# Patient Record
Sex: Male | Born: 1985 | Hispanic: No | Marital: Single | Smoking: Never smoker
Health system: Southern US, Community
[De-identification: ages and names within clinical notes are randomized; demographics above are authoritative.]

## PROBLEM LIST (undated history)

## (undated) DIAGNOSIS — F419 Anxiety disorder, unspecified: Secondary | ICD-10-CM

## (undated) DIAGNOSIS — R569 Unspecified convulsions: Secondary | ICD-10-CM

## (undated) DIAGNOSIS — F32A Depression, unspecified: Secondary | ICD-10-CM

## (undated) HISTORY — DX: Anxiety disorder, unspecified: F41.9

## (undated) HISTORY — DX: Depression, unspecified: F32.A

## (undated) HISTORY — DX: Unspecified convulsions: R56.9

---
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
## 2007-07-15 ENCOUNTER — Emergency Department (HOSPITAL_COMMUNITY): Admission: EM | Admit: 2007-07-15 | Discharge: 2007-07-15 | Payer: Self-pay | Admitting: Emergency Medicine

## 2007-07-22 ENCOUNTER — Emergency Department (HOSPITAL_COMMUNITY): Admission: EM | Admit: 2007-07-22 | Discharge: 2007-07-22 | Payer: Self-pay | Admitting: Emergency Medicine

## 2009-03-02 ENCOUNTER — Emergency Department (HOSPITAL_COMMUNITY): Admission: EM | Admit: 2009-03-02 | Discharge: 2009-03-02 | Payer: Self-pay | Admitting: Emergency Medicine

## 2010-04-17 ENCOUNTER — Emergency Department (HOSPITAL_COMMUNITY): Admission: EM | Admit: 2010-04-17 | Discharge: 2010-04-17 | Payer: Self-pay | Admitting: Family Medicine

## 2011-02-07 LAB — POCT I-STAT, CHEM 8
BUN: 13 mg/dL (ref 6–23)
Chloride: 101 mEq/L (ref 96–112)
Creatinine, Ser: 0.9 mg/dL (ref 0.4–1.5)
Glucose, Bld: 98 mg/dL (ref 70–99)
HCT: 44 % (ref 39.0–52.0)
Potassium: 4.1 mEq/L (ref 3.5–5.1)
Sodium: 140 mEq/L (ref 135–145)

## 2012-05-22 ENCOUNTER — Emergency Department (HOSPITAL_COMMUNITY)
Admission: EM | Admit: 2012-05-22 | Discharge: 2012-05-22 | Payer: Self-pay | Attending: Emergency Medicine | Admitting: Emergency Medicine

## 2012-05-22 ENCOUNTER — Encounter (HOSPITAL_COMMUNITY): Payer: Self-pay

## 2012-05-22 ENCOUNTER — Emergency Department (HOSPITAL_COMMUNITY)
Admission: EM | Admit: 2012-05-22 | Discharge: 2012-05-22 | Disposition: A | Discharge status: Home / Self Care | Payer: Self-pay | Attending: Emergency Medicine | Admitting: Emergency Medicine

## 2012-05-22 DIAGNOSIS — F1910 Other psychoactive substance abuse, uncomplicated: Secondary | ICD-10-CM

## 2012-05-22 DIAGNOSIS — Z0189 Encounter for other specified special examinations: Secondary | ICD-10-CM | POA: Insufficient documentation

## 2012-05-22 DIAGNOSIS — Z1389 Encounter for screening for other disorder: Secondary | ICD-10-CM | POA: Insufficient documentation

## 2012-05-22 DIAGNOSIS — IMO0001 Reserved for inherently not codable concepts without codable children: Secondary | ICD-10-CM | POA: Insufficient documentation

## 2012-05-22 LAB — HIV RAPID SCREEN (BLD OR BODY FLD EXPOSURE): Rapid HIV Screen: NONREACTIVE
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
## 2012-05-23 ENCOUNTER — Encounter (HOSPITAL_COMMUNITY): Payer: Self-pay

## 2012-05-23 LAB — HEPATITIS C ANTIBODY (REFLEX): HCV Ab: NEGATIVE

## 2012-05-23 LAB — HEPATITIS B SURFACE ANTIGEN: Hepatitis B Surface Ag: NEGATIVE

## 2012-05-30 NOTE — ED Provider Notes (Signed)
Medical screening examination/treatment/procedure(s) were performed by non-physician practitioner and as supervising physician I was immediately available for consultation/collaboration.  Raeford Razor, MD 05/30/12 [phone]

## 2017-03-27 ENCOUNTER — Ambulatory Visit
Admission: RE | Admit: 2017-03-27 | Discharge: 2017-03-27 | Disposition: A | Discharge status: Home / Self Care | Payer: No Typology Code available for payment source | Source: Ambulatory Visit | Attending: Internal Medicine | Admitting: Internal Medicine

## 2017-03-27 ENCOUNTER — Other Ambulatory Visit: Payer: Self-pay | Admitting: Internal Medicine

## 2017-03-27 DIAGNOSIS — R7611 Nonspecific reaction to tuberculin skin test without active tuberculosis: Secondary | ICD-10-CM

## 2017-09-22 ENCOUNTER — Emergency Department (HOSPITAL_COMMUNITY): Payer: Self-pay

## 2017-09-22 ENCOUNTER — Encounter (HOSPITAL_COMMUNITY): Payer: Self-pay

## 2017-09-22 ENCOUNTER — Emergency Department (HOSPITAL_COMMUNITY)
Admission: EM | Admit: 2017-09-22 | Discharge: 2017-09-22 | Disposition: A | Discharge status: Home / Self Care | Payer: Self-pay | Attending: Emergency Medicine | Admitting: Emergency Medicine

## 2017-09-22 DIAGNOSIS — R569 Unspecified convulsions: Secondary | ICD-10-CM | POA: Insufficient documentation

## 2017-09-22 LAB — CBC WITH DIFFERENTIAL/PLATELET
BASOS ABS: 0 10*3/uL (ref 0.0–0.1)
Basophils Relative: 0 %
EOS PCT: 0 %
Eosinophils Absolute: 0 10*3/uL (ref 0.0–0.7)
HEMATOCRIT: 46.7 % (ref 39.0–52.0)
HEMOGLOBIN: 15.4 g/dL (ref 13.0–17.0)
LYMPHS ABS: 1.7 10*3/uL (ref 0.7–4.0)
LYMPHS PCT: 12 %
MCH: 26.9 pg (ref 26.0–34.0)
MCHC: 33 g/dL (ref 30.0–36.0)
MCV: 81.5 fL (ref 78.0–100.0)
MONOS PCT: 6 %
Monocytes Absolute: 0.9 10*3/uL (ref 0.1–1.0)
NEUTROS ABS: 11.7 10*3/uL — AB (ref 1.7–7.7)
Neutrophils Relative %: 82 %
Platelets: 234 10*3/uL (ref 150–400)
RBC: 5.73 MIL/uL (ref 4.22–5.81)
RDW: 14.6 % (ref 11.5–15.5)
WBC: 14.3 10*3/uL — ABNORMAL HIGH (ref 4.0–10.5)

## 2017-09-22 LAB — CK: CK TOTAL: 141 U/L (ref 49–397)

## 2017-09-22 LAB — COMPREHENSIVE METABOLIC PANEL
ALBUMIN: 4.2 g/dL (ref 3.5–5.0)
ALK PHOS: 48 U/L (ref 38–126)
ALT: 12 U/L — ABNORMAL LOW (ref 17–63)
AST: 20 U/L (ref 15–41)
Anion gap: 8 (ref 5–15)
BILIRUBIN TOTAL: 1 mg/dL (ref 0.3–1.2)
BUN: 12 mg/dL (ref 6–20)
CALCIUM: 9 mg/dL (ref 8.9–10.3)
CO2: 26 mmol/L (ref 22–32)
Chloride: 101 mmol/L (ref 101–111)
Creatinine, Ser: 1.16 mg/dL (ref 0.61–1.24)
GFR calc Af Amer: >60 mL/min (ref >60)
GLUCOSE: 106 mg/dL — AB (ref 65–99)
Potassium: 4.1 mmol/L (ref 3.5–5.1)
Sodium: 135 mmol/L (ref 135–145)
TOTAL PROTEIN: 6.9 g/dL (ref 6.5–8.1)

## 2017-09-22 LAB — I-STAT TROPONIN, ED: Troponin i, poc: 0.01 ng/mL (ref 0.00–0.08)

## 2017-09-22 NOTE — ED Notes (Signed)
Patient verbalizes understanding of discharge instructions. Opportunity for questioning and answers were provided. 

## 2017-09-22 NOTE — ED Notes (Signed)
Pt is difficult stick, unable to obtain blood work at this time

## 2017-09-22 NOTE — ED Notes (Addendum)
Explained to pt need for urine specimen, pt verbalized understanding. Per Dr. Estell HarpinZammit, pt can have something to drink.

## 2017-09-22 NOTE — ED Notes (Signed)
Pt refused chest xray, CT completed.

## 2017-09-22 NOTE — ED Notes (Signed)
md bedside at this time

## 2017-09-22 NOTE — ED Notes (Signed)
Patient transported to X-ray 

## 2017-09-22 NOTE — ED Notes (Signed)
Per family, pt is Muslim and should not be served pork

## 2017-09-22 NOTE — ED Triage Notes (Signed)
Pt arrives to ED from home via ems with complaints of seizure activity while in the shower since 1030 this morning. Pt states he does not remember anything, but woke up in his bedroom floor with his family around him. EMS stated pt had one seizure 6 months ago but was not hospitalized. Pt was recently taken off of xanax approx 1 week ago by the pt because he did not like feeling dependent on anything. Pt placed in position of comfort with bed locked and lowered, call bell in reach.

## 2017-09-22 NOTE — ED Notes (Signed)
Pt wishing to go home, would like to speak with the MD regarding the plan moving forward. Pt reports he was told by radiology that he had a choice whether or not he got the scheduled chest x-ray, and he said he did not want it. Pt continues to refuse xray at this time.

## 2017-09-22 NOTE — Discharge Instructions (Signed)
Follow-up with your family doctor next week for recheck.  Do not drive a car until you are cleared by a physician. return if any problems

## 2017-09-23 NOTE — ED Provider Notes (Signed)
[facility] EMERGENCY DEPARTMENT Provider Note   CSN: [id number] Arrival date & time: 09/22/17  1139     History   Chief Complaint Chief Complaint  Patient presents with  . Seizures    HPI Matthew Roach is a 31 y.o. male.  Patient was going into the shower and had a syncopal episode.  Family members said that he had a seizure.  Patient has been taking Xanax and has been decreasing it.  He supposedly had one seizure 6 months prior   The history is provided by the patient.  Seizures   This is a recurrent problem. The current episode started less than 1 hour ago. The problem has been resolved. There was 1 seizure. Pertinent negatives include no headaches, no chest pain, no cough and no diarrhea. Characteristics do not include eye blinking. The episode was witnessed. There was no sensation of an aura present. The seizures did not continue in the ED. The seizure(s) had no focality. Possible causes include medication or dosage change. There has been no fever.    History reviewed. No pertinent past medical history.  There are no active problems to display for this patient.   History reviewed. No pertinent surgical history.     Home Medications    Prior to Admission medications   Not on File    Family History History reviewed. No pertinent family history.  Social History Social History  Substance Use Topics  . Smoking status: Never Smoker  . Smokeless tobacco: Not on file  . Alcohol use No     Allergies   Patient has no known allergies.   Review of Systems Review of Systems  Constitutional: Negative for appetite change and fatigue.  HENT: Negative for congestion, ear discharge and sinus pressure.   Eyes: Negative for discharge.  Respiratory: Negative for cough.   Cardiovascular: Negative for chest pain.  Gastrointestinal: Negative for abdominal pain and diarrhea.  Genitourinary: Negative for frequency and hematuria.  Musculoskeletal:  Negative for back pain.  Skin: Negative for rash.  Neurological: Positive for seizures. Negative for headaches.  Psychiatric/Behavioral: Negative for hallucinations.     Physical Exam Updated Vital Signs BP 124/84   Pulse (!) 104   Temp 99.1 F (37.3 C) (Oral)   Resp (!) 25   Ht 6\' 3"  (1.905 m)   Wt 88.5 kg (195 lb)   SpO2 98%   BMI 24.37 kg/m   Physical Exam  Constitutional: He is oriented to person, place, and time. He appears well-developed.  HENT:  Head: Normocephalic.  Eyes: Conjunctivae and EOM are normal. No scleral icterus.  Neck: Neck supple. No thyromegaly present.  Cardiovascular: Normal rate and regular rhythm.  Exam reveals no gallop and no friction rub.   No murmur heard. Pulmonary/Chest: No stridor. He has no wheezes. He has no rales. He exhibits no tenderness.  Abdominal: He exhibits no distension. There is no tenderness. There is no rebound.  Musculoskeletal: Normal range of motion. He exhibits no edema.  Lymphadenopathy:    He has no cervical adenopathy.  Neurological: He is oriented to person, place, and time. He exhibits normal muscle tone. Coordination normal.  Skin: No rash noted. No erythema.  Psychiatric: He has a normal mood and affect. His behavior is normal.     ED Treatments / Results  Labs (all labs ordered are listed, but only abnormal results are displayed) Labs Reviewed  CBC WITH DIFFERENTIAL/PLATELET - Abnormal; Notable for the following:       Result  Value   WBC 14.3 (*)    Neutro Abs 11.7 (*)    All other components within normal limits  COMPREHENSIVE METABOLIC PANEL - Abnormal; Notable for the following:    Glucose, Bld 106 (*)    ALT 12 (*)    All other components within normal limits  CK  I-STAT TROPONIN, ED    EKG  EKG Interpretation None       Radiology Ct Head Wo Contrast  Result Date: 09/22/2017 CLINICAL DATA:  Seizure earlier today.  Headache currently. EXAM: CT HEAD WITHOUT CONTRAST TECHNIQUE: Contiguous  axial images were obtained from the base of the skull through the vertex without intravenous contrast. COMPARISON:  None. FINDINGS: Brain: The ventricles are normal in size and configuration. There is no intracranial mass, hemorrhage, extra-axial fluid collection, or midline shift. Gray-white compartments appear normal. No acute infarct evident. Vascular: There is no hyperdense vessel. No vascular calcifications are evident. Skull: Bony calvarium appears intact. Sinuses/Orbits: There is a retention cyst in the inferior right maxillary antrum. There is mild mucosal thickening in several ethmoid air cells bilaterally. Other paranasal sinuses are clear. Orbits appear symmetric bilaterally. Other: Mastoid air cells are clear. IMPRESSION: Areas of paranasal sinus disease. Study otherwise unremarkable. No intracranial mass, hemorrhage, or extra-axial fluid collection. Gray-white compartments appear normal. Electronically Signed   By: Bretta BangWilliam  Woodruff III M.D.   On: 09/22/2017 14:12    Procedures Procedures (including critical care time)  Medications Ordered in ED Medications - No data to display   Initial Impression / Assessment and Plan / ED Course  I have reviewed the triage vital signs and the nursing notes.  Pertinent labs & imaging results that were available during my care of the patient were reviewed by me and considered in my medical decision making (see chart for details).     Labs EKG and CT head unremarkable.  Patient refused chest x-ray.  Patient was observed in the emergency department and did not have a seizure.  He is told not to drive anymore and to follow-up with the family doctor right now he is going to decrease his Xanax from half milligram a day to 1/4 mg a day and talk to his family doctor about possible getting an EEG done  Final Clinical Impressions(s) / ED Diagnoses   Final diagnoses:  Seizure (HCC)    New Prescriptions There are no discharge medications for this  patient.    Bethann BerkshireZammit, Zaydrian Batta, MD 09/23/17 [phone]

## 2018-10-29 IMAGING — CR DG CHEST 1V
1 series · 1 of 1 positions shown · non-contrast
Comparison: None in PACs

CLINICAL DATA: Positive PPD, annual screening exam, no current
complaints.

EXAM:
CHEST 1 VIEW

[w chest pa]
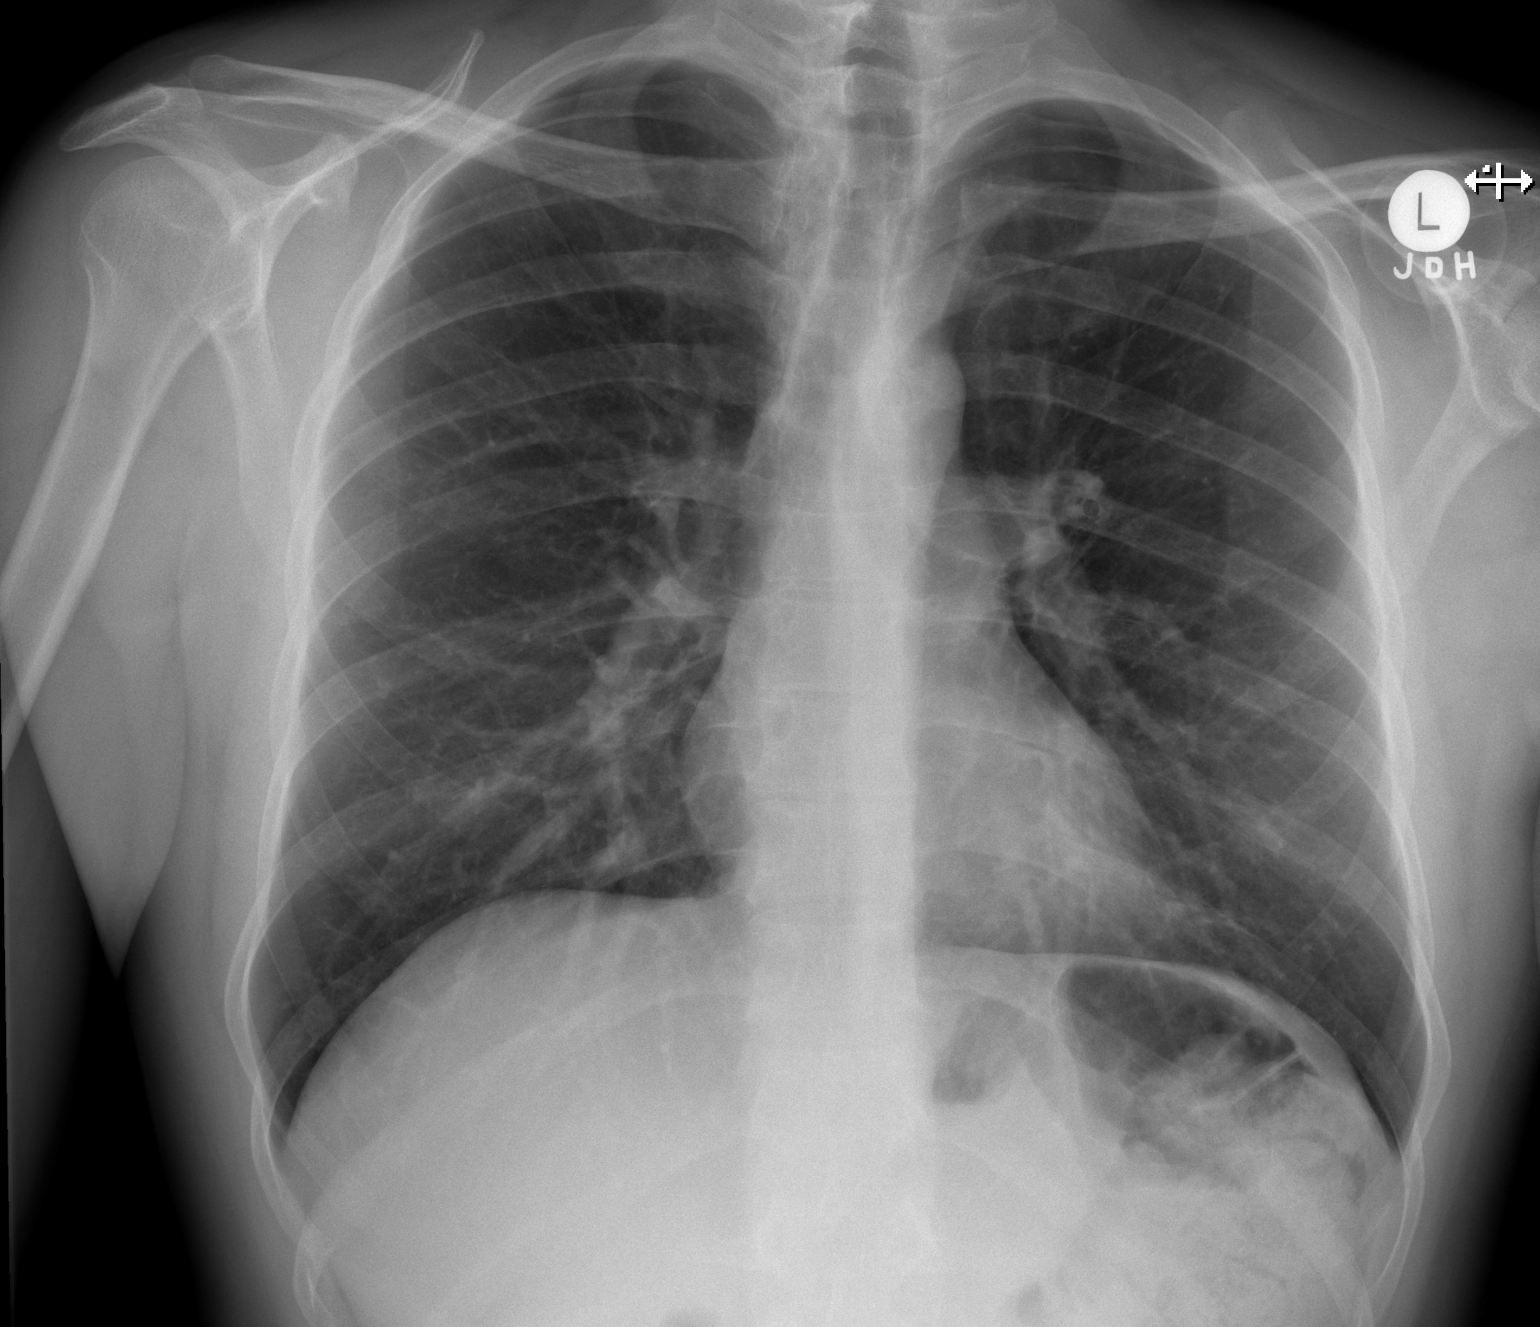

[1 of 1 positions shown; findings below may reference images not displayed]

FINDINGS: The lungs are adequately inflated and clear. The heart and pulmonary
vascularity are normal. The mediastinum is normal in width. There is
no pleural effusion. The bony thorax exhibits no acute abnormality.
IMPRESSION: There is no evidence of acute or old tuberculous infection. There is
no acute cardiopulmonary abnormality.

## 2022-03-08 ENCOUNTER — Ambulatory Visit (INDEPENDENT_AMBULATORY_CARE_PROVIDER_SITE_OTHER): Payer: 59 | Admitting: Family Medicine

## 2022-03-08 ENCOUNTER — Encounter: Payer: Self-pay | Admitting: Family Medicine

## 2022-03-08 VITALS — BP 100/70 | HR 87 | Temp 99.0°F | Ht 74.0 in | Wt 167.0 lb

## 2022-03-08 DIAGNOSIS — F419 Anxiety disorder, unspecified: Secondary | ICD-10-CM | POA: Diagnosis not present

## 2022-03-08 DIAGNOSIS — G40909 Epilepsy, unspecified, not intractable, without status epilepticus: Secondary | ICD-10-CM

## 2022-03-08 MED ORDER — FLUOXETINE HCL 10 MG PO CAPS: 10.0000 mg | ORAL_CAPSULE | Freq: Every day | ORAL | 1 refills | Status: DC

## 2022-03-08 MED ORDER — CLONAZEPAM 1 MG PO TABS: 1.0000 mg | ORAL_TABLET | Freq: Two times a day (BID) | ORAL | 1 refills | Status: DC | PRN
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
## 2022-03-08 NOTE — Patient Instructions (Signed)
Welcome to [facility] at [facility]! It was a pleasure meeting you today.  As discussed, Please schedule a 1 month follow up visit today.  PLEASE NOTE:  If you had any LAB tests please let us know if you have not heard back within a few days. You may see your results on MyChart before we have a chance to review them but we will give you a call once they are reviewed by us. If we ordered any REFERRALS today, please let us know if you have not heard from their office within the next week.  Let us know through MyChart if you are needing REFILLS, or have your pharmacy send us the request. You can also use MyChart to communicate with me or any office staff.  Please try these tips to maintain a healthy lifestyle:  Eat most of your calories during the day when you are active. Eliminate processed foods including packaged sweets (pies, cakes, cookies), reduce intake of potatoes, white bread, white pasta, and white rice. Look for whole grain options, oat flour or almond flour.  Each meal should contain half fruits/vegetables, one quarter protein, and one quarter carbs (no bigger than a computer mouse).  Cut down on sweet beverages. This includes juice, soda, and sweet tea. Also watch fruit intake, though this is a healthier sweet option, it still contains natural sugar! Limit to 3 servings daily.  Drink at least 1 glass of water with each meal and aim for at least 8 glasses per day  Exercise at least 150 minutes every week.   

## 2022-03-09 LAB — TSH: TSH: 1.48 u[IU]/mL (ref 0.35–5.50)

## 2022-03-09 LAB — COMPREHENSIVE METABOLIC PANEL
ALT: 29 U/L (ref 0–53)
AST: 17 U/L (ref 0–37)
Albumin: 5.3 g/dL — ABNORMAL HIGH (ref 3.5–5.2)
Alkaline Phosphatase: 55 U/L (ref 39–117)
BUN: 20 mg/dL (ref 6–23)
CO2: 34 mEq/L — ABNORMAL HIGH (ref 19–32)
Calcium: 10.8 mg/dL — ABNORMAL HIGH (ref 8.4–10.5)
Chloride: 97 mEq/L (ref 96–112)
Creatinine, Ser: 0.91 mg/dL (ref 0.40–1.50)
GFR: 110.74 mL/min (ref >60.00)
Glucose, Bld: 91 mg/dL (ref 70–99)
Potassium: 4.3 mEq/L (ref 3.5–5.1)
Sodium: 142 mEq/L (ref 135–145)
Total Bilirubin: 1 mg/dL (ref 0.2–1.2)
Total Protein: 8 g/dL (ref 6.0–8.3)

## 2022-03-09 LAB — CBC WITH DIFFERENTIAL/PLATELET
Basophils Absolute: 0.1 10*3/uL (ref 0.0–0.1)
Basophils Relative: 1 % (ref 0.0–3.0)
Eosinophils Absolute: 0.5 10*3/uL (ref 0.0–0.7)
Eosinophils Relative: 6.7 % — ABNORMAL HIGH (ref 0.0–5.0)
HCT: 41.2 % (ref 39.0–52.0)
Hemoglobin: 13.4 g/dL (ref 13.0–17.0)
Lymphocytes Relative: 46.6 % — ABNORMAL HIGH (ref 12.0–46.0)
Lymphs Abs: 3.2 10*3/uL (ref 0.7–4.0)
MCHC: 32.5 g/dL (ref 30.0–36.0)
MCV: 81.4 fl (ref 78.0–100.0)
Monocytes Absolute: 0.4 10*3/uL (ref 0.1–1.0)
Monocytes Relative: 6.3 % (ref 3.0–12.0)
Neutro Abs: 2.7 10*3/uL (ref 1.4–7.7)
Neutrophils Relative %: 39.4 % — ABNORMAL LOW (ref 43.0–77.0)
Platelets: 318 10*3/uL (ref 150.0–400.0)
RBC: 5.06 Mil/uL (ref 4.22–5.81)
RDW: 13.2 % (ref 11.5–15.5)
WBC: 6.9 10*3/uL (ref 4.0–10.5)

## 2022-03-09 LAB — VITAMIN B12: Vitamin B-12: 551 pg/mL (ref 211–911)

## 2022-03-09 LAB — MAGNESIUM: Magnesium: 1.9 mg/dL (ref 1.5–2.5)

## 2022-03-09 LAB — HEMOGLOBIN A1C: Hgb A1c MFr Bld: 5.8 % (ref 4.6–6.5)

## 2022-03-10 ENCOUNTER — Encounter: Payer: Self-pay | Admitting: Neurology

## 2022-03-15 LAB — HEPATITIS C ANTIBODY
Hepatitis C Ab: NONREACTIVE
SIGNAL TO CUT-OFF: 0.08 (ref <1.00)

## 2022-03-15 LAB — DRUG MONITORING, PANEL 8 WITH CONFIRMATION, URINE

## 2022-03-15 LAB — RPR: RPR Ser Ql: NONREACTIVE

## 2022-03-15 LAB — DM TEMPLATE

## 2022-03-15 LAB — HIV ANTIBODY (ROUTINE TESTING W REFLEX): HIV 1&2 Ab, 4th Generation: NONREACTIVE

## 2022-03-28 ENCOUNTER — Ambulatory Visit: Payer: 59 | Admitting: Neurology

## 2022-03-28 ENCOUNTER — Encounter: Payer: Self-pay | Admitting: Neurology

## 2022-03-28 VITALS — BP 113/74 | Ht 75.0 in | Wt 170.0 lb

## 2022-03-28 DIAGNOSIS — R569 Unspecified convulsions: Secondary | ICD-10-CM | POA: Diagnosis not present

## 2022-03-28 DIAGNOSIS — R252 Cramp and spasm: Secondary | ICD-10-CM | POA: Diagnosis not present
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
## 2022-03-28 NOTE — Patient Instructions (Signed)
Good to meet you. ? ?Schedule MRI brain with and without contrast ? ?2. Schedule 1-hour EEG. If normal, we will do a 24-hour EEG ? ?3. Follow-up after tests, call for any changes ? ? ?Seizure Precautions: ?1. If medication has been prescribed for you to prevent seizures, take it exactly as directed.  Do not stop taking the medicine without talking to your doctor first, even if you have not had a seizure in a long time.  ? ?2. Avoid activities in which a seizure would cause danger to yourself or to others.  Don't operate dangerous machinery, swim alone, or climb in high or dangerous places, such as on ladders, roofs, or girders.  Do not drive unless your doctor says you may. ? ?3. If you have any warning that you may have a seizure, lay down in a safe place where you can't hurt yourself.   ? ?4.  No driving for 6 months from last seizure, as per [facility].   Please refer to the following link on the [place] website for more information: http://www.epilepsyfoundation.org/answerplace/Social/driving/drivingu.cfm  ? ?5.  Maintain good sleep hygiene. Avoid alcohol. ? ?6.  Contact your doctor if you have any problems that may be related to the medicine you are taking. ? ?7.  Call 911 and bring the patient back to the ED if: ?      ? A.  The seizure lasts longer than 5 minutes.      ? B.  The patient doesn't awaken shortly after the seizure ? C.  The patient has new problems such as difficulty seeing, speaking or moving ? D.  The patient was injured during the seizure ? E.  The patient has a temperature over 102 F (39C) ? F.  The patient vomited and now is having trouble breathing ?      ? ?

## 2022-03-30 ENCOUNTER — Other Ambulatory Visit: Payer: Self-pay | Admitting: Family Medicine

## 2022-04-06 ENCOUNTER — Ambulatory Visit: Payer: 59 | Admitting: Family Medicine

## 2022-04-11 ENCOUNTER — Ambulatory Visit (INDEPENDENT_AMBULATORY_CARE_PROVIDER_SITE_OTHER): Payer: 59 | Admitting: Neurology

## 2022-04-11 DIAGNOSIS — R252 Cramp and spasm: Secondary | ICD-10-CM | POA: Diagnosis not present

## 2022-04-11 DIAGNOSIS — R569 Unspecified convulsions: Secondary | ICD-10-CM | POA: Diagnosis not present

## 2022-04-13 ENCOUNTER — Encounter: Payer: Self-pay | Admitting: Family Medicine

## 2022-04-13 ENCOUNTER — Ambulatory Visit (INDEPENDENT_AMBULATORY_CARE_PROVIDER_SITE_OTHER): Payer: 59 | Admitting: Family Medicine

## 2022-04-13 ENCOUNTER — Ambulatory Visit (HOSPITAL_COMMUNITY)
Admission: RE | Admit: 2022-04-13 | Discharge: 2022-04-13 | Disposition: A | Discharge status: Home / Self Care | Payer: 59 | Source: Ambulatory Visit | Attending: Neurology | Admitting: Neurology

## 2022-04-13 VITALS — BP 120/82 | HR 89 | Temp 98.0°F | Ht 74.0 in | Wt 184.4 lb

## 2022-04-13 DIAGNOSIS — R569 Unspecified convulsions: Secondary | ICD-10-CM | POA: Diagnosis present

## 2022-04-13 DIAGNOSIS — R252 Cramp and spasm: Secondary | ICD-10-CM

## 2022-04-13 DIAGNOSIS — G40909 Epilepsy, unspecified, not intractable, without status epilepticus: Secondary | ICD-10-CM | POA: Diagnosis not present

## 2022-04-13 DIAGNOSIS — F419 Anxiety disorder, unspecified: Secondary | ICD-10-CM | POA: Diagnosis not present

## 2022-04-13 MED ORDER — FLUOXETINE HCL 20 MG PO CAPS: 20.0000 mg | ORAL_CAPSULE | Freq: Every day | ORAL | 1 refills | Status: DC

## 2022-04-13 MED ORDER — GADOBUTROL 1 MMOL/ML IV SOLN
8.0000 mL | Freq: Once | INTRAVENOUS | Status: AC | PRN
  Administered 2022-04-13: 8 mL via INTRAVENOUS

## 2022-04-13 NOTE — Patient Instructions (Signed)
It was very nice to see you today!  [facility] Phone: [phone]-call them about psych referral  Will increase prozac to 20mg .     PLEASE NOTE:  If you had any lab tests please let know if you have not heard back within a few days. You may see your results on MyChart before we have a chance to review them but we will give you a call once they are reviewed by [place]. If we ordered any referrals today, please let us know if you have not heard from their office within the next week.   Please try these tips to maintain a healthy lifestyle:  Eat most of your calories during the day when you are active. Eliminate processed foods including packaged sweets (pies, cakes, cookies), reduce intake of potatoes, white bread, white pasta, and white rice. Look for whole grain options, oat flour or almond flour.  Each meal should contain half fruits/vegetables, one quarter protein, and one quarter carbs (no bigger than a computer mouse).  Cut down on sweet beverages. This includes juice, soda, and sweet tea. Also watch fruit intake, though this is a healthier sweet option, it still contains natural sugar! Limit to 3 servings daily.  Drink at least 1 glass of water with each meal and aim for at least 8 glasses per day  Exercise at least 150 minutes every week.

## 2022-04-13 NOTE — Progress Notes (Signed)
   Subjective:     Patient ID: Matthew Roach, male    DOB: 1986/05/06, 36 y.o.   MRN: [id number]  Chief Complaint  Patient presents with   Follow-up    4 week follow-up on medications     HPI Anxiety-back on prozac-once/day for 2-3 wks and klonoprin bid. No benefits yet.  No SI.  Some fatigue and occ worse panic. Some strange taste.  Sz-saw neuro.  MRI today. EEG done.no sz but still twitches  Root canal sch for tomorrow.  Taking ibuprofen for pain  There are no preventive care reminders to display for this patient.   Past Medical History:  Diagnosis Date   Anxiety    Depression    Seizures (HCC)     History reviewed. No pertinent surgical history.  Outpatient Medications Prior to Visit  Medication Sig Dispense Refill   clonazePAM (KLONOPIN) 1 MG tablet Take 1 tablet (1 mg total) by mouth 2 (two) times daily as needed. 60 tablet 1   FLUoxetine (PROZAC) 10 MG capsule Take 1 capsule (10 mg total) by mouth daily. 30 capsule 1   diazepam (VALIUM) 5 MG tablet Take by mouth. (Patient not taking: Reported on 04/13/2022)     No facility-administered medications prior to visit.    Allergies  Allergen Reactions   Banana Hives   ROS neg/noncontributory except as noted HPI/below Growth behind L ear since childhood-no change but wants to see derm      Objective:     BP 120/82   Pulse 89   Temp 98 F (36.7 C) (Temporal)   Ht 6\' 2"  (1.88 m)   Wt 184 lb 6 oz (83.6 kg)   SpO2 98%   BMI 23.67 kg/m  Wt Readings from Last 3 Encounters:  04/13/22 184 lb 6 oz (83.6 kg)  03/28/22 170 lb (77.1 kg)  03/08/22 167 lb (75.8 kg)    Physical Exam   Gen: WDWN NAD HEENT: NCAT, conjunctiva not injected, sclera nonicteric NECK:  supple, no thyromegaly, no nodes, no carotid bruits CARDIAC: RRR, S1S2+, no murmur. DP 2+B LUNGS: CTAB. No wheezes ABDOMEN:  BS+, soft, NTND, No HSM, no masses EXT:  no edema MSK: no gross abnormalities.  NEURO: A&O x3.  CN II-XII intact.  PSYCH:  normal mood. Good eye contact Approx 8-62mm fleshy growth behind L ear     Assessment & Plan:   Problem List Items Addressed This Visit       Other   Anxiety - Primary   Relevant Medications   diazepam (VALIUM) 5 MG tablet   FLUoxetine (PROZAC) 20 MG capsule   Other Visit Diagnoses     Seizure disorder (HCC)          Anxiety-will increase prozac to 20mg .  [facility] number to [facility] to f/u on referral.  Cont klonopin for now.  Fatigue may be that but need to keep sz part in control Sz disorder-had EEG, saw neuro.  MRI sch for today.   Skin lesion-pt will call derm and let know if needs referral.   Meds ordered this encounter  Medications   FLUoxetine (PROZAC) 20 MG capsule    Sig: Take 1 capsule (20 mg total) by mouth daily.    Dispense:  30 capsule    Refill:  1    [facility], MD

## 2022-04-20 NOTE — Procedures (Signed)
ELECTROENCEPHALOGRAM REPORT  Date of Study: 04/11/2022  Patient's Name: Matthew Roach MRN: [id number] Date of Birth: [birth year]  Referring Provider: Dr. Patrcia Dolly  Clinical History: This is a 36 year old man with convulsions and body jerks. EEG for classification.  Medications: Clonazepam Prozac  Technical Summary: A multichannel digital 1-hour EEG recording measured by the international 10-20 system with electrodes applied with paste and impedances below 5000 ohms performed in our laboratory with EKG monitoring in an awake and asleep patient.  Hyperventilation and photic stimulation were performed.  The digital EEG was referentially recorded, reformatted, and digitally filtered in a variety of bipolar and referential montages for optimal display.    Description: The patient is awake and asleep during the recording.  During maximal wakefulness, there is a symmetric, medium voltage 9-10 Hz posterior dominant rhythm that attenuates with eye opening.  The record is symmetric.  During drowsiness and sleep, there is an increase in theta slowing of the background.  Vertex waves and symmetric sleep spindles were seen. Hyperventilation and photic stimulation did not elicit any abnormalities.  There were no epileptiform discharges or electrographic seizures seen.    EKG lead was unremarkable.  Impression: This 1-hour awake and asleep EEG is normal.    Clinical Correlation: A normal EEG does not exclude a clinical diagnosis of epilepsy.  If further clinical questions remain, prolonged EEG may be helpful.  Clinical correlation is advised.   Patrcia Dolly, M.D.

## 2022-04-30 ENCOUNTER — Other Ambulatory Visit: Payer: Self-pay | Admitting: Family Medicine

## 2022-05-01 ENCOUNTER — Other Ambulatory Visit: Payer: Self-pay | Admitting: Family Medicine

## 2022-05-01 MED ORDER — FLUOXETINE HCL 20 MG PO CAPS: 20.0000 mg | ORAL_CAPSULE | Freq: Every day | ORAL | 1 refills | Status: DC

## 2022-05-03 ENCOUNTER — Other Ambulatory Visit: Payer: Self-pay | Admitting: Family Medicine

## 2022-05-11 ENCOUNTER — Telehealth: Payer: Self-pay

## 2022-05-11 ENCOUNTER — Telehealth: Payer: Self-pay | Admitting: Neurology

## 2022-05-11 NOTE — Telephone Encounter (Signed)
-----   Message from Van Clines, MD sent at 05/06/2022  4:01 PM EDT ----- Pls let him know the brain MRI looked fine, no tumor, stroke, or bleed. His EEG was normal, however it is not like a pregnancy test that is positive or negative. If he continues to have the jerking every night, I would like to do a home EEG where it is on for 48-hours and we can try to capture the jerking to see what the brain waves show when they occur. If he is agreeable, pls order 48-hour EEG. thanks

## 2022-05-11 NOTE — Telephone Encounter (Signed)
Patient called back and stated he would like to move forward with the at home EEG.  Please call him back.

## 2022-05-11 NOTE — Telephone Encounter (Signed)
Pt called an was given test results the brain MRI looked fine, no tumor, stroke, or bleed. His EEG was normal, however it is not like a pregnancy test that is positive or negative. If he continues to have the jerking every night, Dr Karel Jarvis would like to do a home EEG where it is on for 48-hours and we can try to capture the jerking to see what the brain waves show when they occur. Pt would like to hold off to see how the jerking is and if any over the weekend and will call back next week to let us know if he wants the EEG

## 2022-06-14 ENCOUNTER — Ambulatory Visit (INDEPENDENT_AMBULATORY_CARE_PROVIDER_SITE_OTHER): Payer: 59 | Admitting: Family Medicine

## 2022-06-14 ENCOUNTER — Encounter: Payer: Self-pay | Admitting: Family Medicine

## 2022-06-14 VITALS — BP 122/60 | HR 86 | Temp 98.5°F | Ht 74.0 in | Wt 180.1 lb

## 2022-06-14 DIAGNOSIS — G40909 Epilepsy, unspecified, not intractable, without status epilepticus: Secondary | ICD-10-CM

## 2022-06-14 DIAGNOSIS — F419 Anxiety disorder, unspecified: Secondary | ICD-10-CM | POA: Diagnosis not present

## 2022-06-14 MED ORDER — CLONAZEPAM 1 MG PO TABS
ORAL_TABLET | ORAL | 2 refills | Status: DC
Start: 2022-06-14 — End: 2022-09-14

## 2022-06-14 MED ORDER — HYDROXYZINE HCL 10 MG PO TABS: 10.0000 mg | ORAL_TABLET | Freq: Three times a day (TID) | ORAL | 3 refills | Status: DC | PRN

## 2022-06-14 NOTE — Progress Notes (Signed)
   Subjective:     Patient ID: Matthew Roach, male    DOB: 13-Apr-1986, 36 y.o.   MRN: [id number]  Chief Complaint  Patient presents with   Follow-up    2 month follow-up on moods     HPI  Anxiety/depression-doing some better on prozac.  Thinking more positively.  No SI.  Some social anxiety so has to take klonopin a trhird time-5-6x/mo.   Sz disorder-now more muscle spasms in different areas.  Limbs will jerk and spasm so some pain.  Worse if no klonopin.   There are no preventive care reminders to display for this patient.  Past Medical History:  Diagnosis Date   Anxiety    Depression    Seizures (HCC)     History reviewed. No pertinent surgical history.  Outpatient Medications Prior to Visit  Medication Sig Dispense Refill   clonazePAM (KLONOPIN) 1 MG tablet TAKE 1 TABLET BY MOUTH 2 TIMES DAILY AS NEEDED. 60 tablet 1   FLUoxetine (PROZAC) 20 MG capsule Take 1 capsule (20 mg total) by mouth daily. 90 capsule 1   diazepam (VALIUM) 5 MG tablet Take by mouth. (Patient not taking: Reported on 04/13/2022)     No facility-administered medications prior to visit.    Allergies  Allergen Reactions   Banana Hives   ROS neg/noncontributory except as noted HPI/below      Objective:     BP 122/60   Pulse 86   Temp 98.5 F (36.9 C) (Temporal)   Ht 6\' 2"  (1.88 m)   Wt 180 lb 2 oz (81.7 kg)   SpO2 96%   BMI 23.13 kg/m  Wt Readings from Last 3 Encounters:  06/14/22 180 lb 2 oz (81.7 kg)  04/13/22 184 lb 6 oz (83.6 kg)  03/28/22 170 lb (77.1 kg)    Physical Exam   Gen: WDWN NAD HEENT: NCAT, conjunctiva not injected, sclera nonicteric NECK:  supple, no thyromegaly, no nodes, no carotid bruits CARDIAC: RRR, S1S2+, no murmur. DP 2+B LUNGS: CTAB. No wheezes ABDOMEN:  BS+, soft, NTND, No HSM, no masses EXT:  no edema MSK: no gross abnormalities. No clonus NEURO: A&O x3.  CN II-XII intact.  PSYCH: normal mood. Good eye contact     Assessment & Plan:   Problem  List Items Addressed This Visit       Other   Anxiety - Primary   Other Visit Diagnoses     Seizure disorder (HCC)          Anxiety-chronic.  Fair control on prozac 20mg -notwant to increase yet.  Cont klonopin 1mg  bid and add hydroxyzine 10mg  tid prn for break thru.   Sz disorder-better, still jerks.  Needs to f/u neuro.   No orders of the defined types were placed in this encounter.   05/28/22, MD

## 2022-06-14 NOTE — Patient Instructions (Signed)
It was very nice to see you today!  Magnesium 250mg  daily   PLEASE NOTE:  If you had any lab tests please let know if you have not heard back within a few days. You may see your results on MyChart before we have a chance to review them but we will give you a call once they are reviewed by [place]. If we ordered any referrals today, please let us know if you have not heard from their office within the next week.   Please try these tips to maintain a healthy lifestyle:  Eat most of your calories during the day when you are active. Eliminate processed foods including packaged sweets (pies, cakes, cookies), reduce intake of potatoes, white bread, white pasta, and white rice. Look for whole grain options, oat flour or almond flour.  Each meal should contain half fruits/vegetables, one quarter protein, and one quarter carbs (no bigger than a computer mouse).  Cut down on sweet beverages. This includes juice, soda, and sweet tea. Also watch fruit intake, though this is a healthier sweet option, it still contains natural sugar! Limit to 3 servings daily.  Drink at least 1 glass of water with each meal and aim for at least 8 glasses per day  Exercise at least 150 minutes every week.

## 2022-07-26 ENCOUNTER — Encounter: Payer: Self-pay | Admitting: Family Medicine

## 2022-07-26 ENCOUNTER — Ambulatory Visit (INDEPENDENT_AMBULATORY_CARE_PROVIDER_SITE_OTHER): Payer: Commercial Managed Care - HMO | Admitting: Family Medicine

## 2022-07-26 VITALS — BP 118/60 | HR 82 | Temp 97.5°F | Ht 74.0 in | Wt 174.0 lb

## 2022-07-26 DIAGNOSIS — L237 Allergic contact dermatitis due to plants, except food: Secondary | ICD-10-CM

## 2022-07-26 MED ORDER — TRIAMCINOLONE ACETONIDE 0.1 % EX CREA: 1.0000 | TOPICAL_CREAM | Freq: Two times a day (BID) | CUTANEOUS | 0 refills | Status: DC

## 2022-07-26 MED ORDER — CEPHALEXIN 500 MG PO CAPS: 500.0000 mg | ORAL_CAPSULE | Freq: Two times a day (BID) | ORAL | 0 refills | Status: AC

## 2022-07-26 MED ORDER — PREDNISONE 20 MG PO TABS: ORAL_TABLET | ORAL | 0 refills | Status: AC

## 2022-07-26 NOTE — Patient Instructions (Addendum)
It was very nice to see you today!  Aveeno/Sarna lotion Can take hydoxyzine for itching   PLEASE NOTE:  If you had any lab tests please let us know if you have not heard back within a few days. You may see your results on MyChart before we have a chance to review them but we will give you a call once they are reviewed by [place]. If we ordered any referrals today, please let us know if you have not heard from their office within the next week.   Please try these tips to maintain a healthy lifestyle:  Eat most of your calories during the day when you are active. Eliminate processed foods including packaged sweets (pies, cakes, cookies), reduce intake of potatoes, white bread, white pasta, and white rice. Look for whole grain options, oat flour or almond flour.  Each meal should contain half fruits/vegetables, one quarter protein, and one quarter carbs (no bigger than a computer mouse).  Cut down on sweet beverages. This includes juice, soda, and sweet tea. Also watch fruit intake, though this is a healthier sweet option, it still contains natural sugar! Limit to 3 servings daily.  Drink at least 1 glass of water with each meal and aim for at least 8 glasses per day  Exercise at least 150 minutes every week.

## 2022-07-26 NOTE — Progress Notes (Signed)
Subjective:     Patient ID: Matthew Roach, male    DOB: [birth year], 36 y.o.   MRN: [id number]  Chief Complaint  Patient presents with   Rash    Red, itchy rash on arms and legs that started 5 or 6 days ago     HPI-here w/mom Red itchy ras on arms and legs for 5-6 days.  Was working under house. All over.  Scratching.  No new meds. Benadryl cream, cortisone cream.  Nephew had sev wks ago as well.  Brother in Social worker in past as well.    Health Maintenance Due  Topic Date Due   TETANUS/TDAP  01/01/2014    Past Medical History:  Diagnosis Date   Anxiety    Depression    Seizures (HCC)     History reviewed. No pertinent surgical history.  Outpatient Medications Prior to Visit  Medication Sig Dispense Refill   clonazePAM (KLONOPIN) 1 MG tablet TAKE 1 TABLET BY MOUTH 2 TIMES DAILY AS NEEDED. 60 tablet 2   FLUoxetine (PROZAC) 20 MG capsule Take 1 capsule (20 mg total) by mouth daily. 90 capsule 1   hydrOXYzine (ATARAX) 10 MG tablet Take 1 tablet (10 mg total) by mouth 3 (three) times daily as needed for anxiety. (Patient not taking: Reported on 07/26/2022) 30 tablet 3   No facility-administered medications prior to visit.    Allergies  Allergen Reactions   Banana Hives   ROS neg/noncontributory except as noted HPI/below      Objective:     BP 118/60   Pulse 82   Temp (!) 97.5 F (36.4 C) (Temporal)   Ht 6\' 2"  (1.88 m)   Wt 174 lb (78.9 kg)   SpO2 95%   BMI 22.34 kg/m  Wt Readings from Last 3 Encounters:  07/26/22 174 lb (78.9 kg)  06/14/22 180 lb 2 oz (81.7 kg)  04/13/22 184 lb 6 oz (83.6 kg)    Physical Exam   Gen: WDWN NAD HEENT: NCAT, conjunctiva not injected, sclera nonicteric EXT:  no edema MSK: no gross abnormalities.  NEURO: A&O x3.  CN II-XII intact.  PSYCH: normal mood. Good eye contact Skin:  red rash arms and legs-scattered.  Some excoriated, some linear, few pustules.      Assessment & Plan:   Problem List Items Addressed This Visit    None Visit Diagnoses     Poison ivy dermatitis    -  Primary      Plant contact dermatitis-suspect some areas of secondary infection-prednisone taper, keflex 500mg  bid, triamcinonole cream 0.2%.  take atarax for itching tid prn.  Sarna lotion.  Educated on poison ivy.   Meds ordered this encounter  Medications   predniSONE (DELTASONE) 20 MG tablet    Sig: Take 3 tablets (60 mg total) by mouth daily with breakfast for 3 days, THEN 2 tablets (40 mg total) daily with breakfast for 3 days, THEN 1 tablet (20 mg total) daily with breakfast for 3 days.    Dispense:  18 tablet    Refill:  0   cephALEXin (KEFLEX) 500 MG capsule    Sig: Take 1 capsule (500 mg total) by mouth 2 (two) times daily for 7 days. Take for 7 days    Dispense:  14 capsule    Refill:  0   triamcinolone cream (KENALOG) 0.1 %    Sig: Apply 1 Application topically 2 (two) times daily.    Dispense:  30 g    Refill:  0  Wellington Hampshire, MD

## 2022-09-14 ENCOUNTER — Other Ambulatory Visit: Payer: Self-pay | Admitting: Family Medicine

## 2022-09-28 ENCOUNTER — Emergency Department (HOSPITAL_BASED_OUTPATIENT_CLINIC_OR_DEPARTMENT_OTHER)
Admission: EM | Admit: 2022-09-28 | Discharge: 2022-09-28 | Disposition: A | Discharge status: Home / Self Care | Payer: Commercial Managed Care - HMO

## 2022-10-05 ENCOUNTER — Other Ambulatory Visit: Payer: Self-pay | Admitting: *Deleted

## 2022-10-05 ENCOUNTER — Encounter: Payer: Self-pay | Admitting: Family Medicine

## 2022-10-05 MED ORDER — FLUOXETINE HCL 40 MG PO CAPS: 40.0000 mg | ORAL_CAPSULE | Freq: Every day | ORAL | 3 refills | Status: DC

## 2022-11-02 ENCOUNTER — Ambulatory Visit (INDEPENDENT_AMBULATORY_CARE_PROVIDER_SITE_OTHER): Payer: Commercial Managed Care - HMO | Admitting: Family Medicine

## 2022-11-02 ENCOUNTER — Encounter: Payer: Self-pay | Admitting: Family Medicine

## 2022-11-02 VITALS — BP 100/60 | HR 72 | Temp 98.0°F | Ht 74.0 in | Wt 175.0 lb

## 2022-11-02 DIAGNOSIS — G40909 Epilepsy, unspecified, not intractable, without status epilepticus: Secondary | ICD-10-CM | POA: Diagnosis not present

## 2022-11-02 DIAGNOSIS — F419 Anxiety disorder, unspecified: Secondary | ICD-10-CM | POA: Diagnosis not present

## 2022-11-02 MED ORDER — CLONAZEPAM 1 MG PO TABS: ORAL_TABLET | ORAL | 5 refills | Status: DC

## 2022-11-02 NOTE — Patient Instructions (Signed)
It was very nice to see you today!  Happy Holidays!   PLEASE NOTE:  If you had any lab tests please let us know if you have not heard back within a few days. You may see your results on MyChart before we have a chance to review them but we will give you a call once they are reviewed by us. If we ordered any referrals today, please let us know if you have not heard from their office within the next week.   Please try these tips to maintain a healthy lifestyle:  Eat most of your calories during the day when you are active. Eliminate processed foods including packaged sweets (pies, cakes, cookies), reduce intake of potatoes, white bread, white pasta, and white rice. Look for whole grain options, oat flour or almond flour.  Each meal should contain half fruits/vegetables, one quarter protein, and one quarter carbs (no bigger than a computer mouse).  Cut down on sweet beverages. This includes juice, soda, and sweet tea. Also watch fruit intake, though this is a healthier sweet option, it still contains natural sugar! Limit to 3 servings daily.  Drink at least 1 glass of water with each meal and aim for at least 8 glasses per day  Exercise at least 150 minutes every week.   

## 2022-11-02 NOTE — Progress Notes (Signed)
   Subjective:     Patient ID: Matthew Roach, male    DOB: 1986/05/12, 36 y.o.   MRN: [id number]  Chief Complaint  Patient presents with   Follow-up    1 month follow-up on medication, increased dose of Prozac, doing okay on medication now, side effects are subsiding    HPI-here w/mom  Depression/anx-on prozac 40mg -recently inc dose.  and clonazepam 1mg  bid.  Doing better.  No SI. Occ spasms at hs if forgets to take clonazepam. Takes bid.  Occ anxiety-hydroxyzine not work.  improving Sz-did the CT, etc. No sz that he's aware of.   There are no preventive care reminders to display for this patient.   Past Medical History:  Diagnosis Date   Anxiety    Depression    Seizures (HCC)     History reviewed. No pertinent surgical history.  Outpatient Medications Prior to Visit  Medication Sig Dispense Refill   FLUoxetine (PROZAC) 40 MG capsule Take 1 capsule (40 mg total) by mouth daily. 90 capsule 3   clonazePAM (KLONOPIN) 1 MG tablet TAKE 1 TABLET BY MOUTH TWICE A DAY AS NEEDED 60 tablet 2   hydrOXYzine (ATARAX) 10 MG tablet Take 1 tablet (10 mg total) by mouth 3 (three) times daily as needed for anxiety. (Patient not taking: Reported on 07/26/2022) 30 tablet 3   triamcinolone cream (KENALOG) 0.1 % Apply 1 Application topically 2 (two) times daily. (Patient not taking: Reported on 11/02/2022) 30 g 0   No facility-administered medications prior to visit.    Allergies  Allergen Reactions   Banana Hives   ROS neg/noncontributory except as noted HPI/below      Objective:     BP 100/60   Pulse 72   Temp 98 F (36.7 C) (Temporal)   Ht 6\' 2"  (1.88 m)   Wt 175 lb (79.4 kg)   SpO2 96%   BMI 22.47 kg/m  Wt Readings from Last 3 Encounters:  11/02/22 175 lb (79.4 kg)  07/26/22 174 lb (78.9 kg)  06/14/22 180 lb 2 oz (81.7 kg)    Physical Exam   Gen: WDWN NAD HEENT: NCAT, conjunctiva not injected, sclera nonicteric NECK:  supple, no thyromegaly, no nodes, no carotid  bruits CARDIAC: RRR, S1S2+, no murmur. DP 2+B LUNGS: CTAB. No wheezes ABDOMEN:  BS+, soft, NTND, No HSM, no masses EXT:  no edema MSK: no gross abnormalities.  NEURO: A&O x3.  CN II-XII intact.  PSYCH: normal mood. Good eye contact     Assessment & Plan:   Problem List Items Addressed This Visit       Other   Anxiety - Primary   Other Visit Diagnoses     Seizure disorder (HCC)       Relevant Medications   clonazePAM (KLONOPIN) 1 MG tablet      Anxiety-chronic.  Better controlled on increased dose of prozac-40mg .  Cont.  Checked pdmp-cont klonopin 1mg  bid.  F/u 56mo ?Sz disorder.  W/u neg thus far.  Cont to monitor.  Pt will f/u neuro first of year-insurance.  Meds ordered this encounter  Medications   clonazePAM (KLONOPIN) 1 MG tablet    Sig: TAKE 1 TABLET BY MOUTH TWICE A DAY AS NEEDED    Dispense:  60 tablet    Refill:  5    Not to exceed 3 additional fills before 12/11/2022    06/16/22, MD

## 2022-12-10 ENCOUNTER — Other Ambulatory Visit: Payer: Self-pay | Admitting: Family Medicine

## 2022-12-16 ENCOUNTER — Encounter: Payer: Self-pay | Admitting: Family Medicine

## 2022-12-16 ENCOUNTER — Other Ambulatory Visit: Payer: Self-pay | Admitting: Family Medicine

## 2022-12-16 MED ORDER — FLUOXETINE HCL 20 MG PO CAPS: 20.0000 mg | ORAL_CAPSULE | Freq: Every day | ORAL | 3 refills | Status: DC

## 2023-03-04 ENCOUNTER — Other Ambulatory Visit: Payer: Self-pay | Admitting: Family Medicine

## 2023-05-02 ENCOUNTER — Other Ambulatory Visit: Payer: Self-pay | Admitting: Family Medicine

## 2023-05-02 NOTE — Telephone Encounter (Signed)
Needs appt

## 2023-06-05 ENCOUNTER — Ambulatory Visit: Payer: MEDICAID | Admitting: Family Medicine

## 2023-06-05 ENCOUNTER — Encounter: Payer: Self-pay | Admitting: Family Medicine

## 2023-06-05 VITALS — BP 110/76 | HR 84 | Temp 98.2°F | Resp 16 | Ht 74.0 in | Wt 167.1 lb

## 2023-06-05 DIAGNOSIS — F419 Anxiety disorder, unspecified: Secondary | ICD-10-CM | POA: Diagnosis not present

## 2023-06-05 MED ORDER — CLONAZEPAM 1 MG PO TABS
ORAL_TABLET | ORAL | 0 refills | Status: DC
Start: 2023-06-05 — End: 2023-07-03

## 2023-06-05 MED ORDER — BUSPIRONE HCL 10 MG PO TABS
10.0000 mg | ORAL_TABLET | Freq: Two times a day (BID) | ORAL | 5 refills | Status: DC
Start: 2023-06-05 — End: 2023-12-21

## 2023-06-05 MED ORDER — FLUOXETINE HCL 20 MG PO CAPS: 20.0000 mg | ORAL_CAPSULE | Freq: Every day | ORAL | 1 refills | Status: DC

## 2023-06-05 NOTE — Assessment & Plan Note (Signed)
Chronic.  Fair control.  Still a lot of stress and anxiety.  SE from prozac 40 so continue on 20mg .  Continue clonazepam 1mg  bid(PDMP checked).  Will add buspar 10mg  bid to see if better control as trying to avoid increasing clonazepam.  Needs to do UDS-couldn't go today.  Limiting refills until UDS done.

## 2023-06-05 NOTE — Progress Notes (Signed)
Subjective:     Patient ID: Matthew Roach, male    DOB: [birth year], 37 y.o.   MRN: [id number]  Chief Complaint  Patient presents with   Anxiety    6 month follow-up on anxiety Fasting     HPI Anxiety - Overall his mood has improved. Is taking Prozac 20 mg. Has noticed side effects with Prozac such as minor hair loss along hair line, but states overall it is helping well. Is taking Clonazepam as needed, when working or attending social events. States if he is not working or going to a social event he has not needed to take it and feels fine. When working, has noticed he feels he needs to take both doses closer together to help over-thinking and fidgety. Got a promotion recently and is taking on more responsibilities with increased stress.  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 States he has frequent muscle spasms and wakes him at night when he has missed doses. Denies any seizures.  Dental surgery - He chipped 2 teeth from his last seizure and will have dental surgery for 2 tooth implants soon.  Shy bladder - is not able to leave urine sample today, has shy bladder. States he has not been drinking as much water as he knows he should be. Will return to leave urine sample at a later date.   There are no preventive care reminders to display for this patient.   Past Medical History:  Diagnosis Date   Anxiety    Depression    Seizures (HCC)     History reviewed. No pertinent surgical history.   Current Outpatient Medications:    busPIRone (BUSPAR) 10 MG tablet, Take 1 tablet (10 mg total) by mouth 2 (two) times daily., Disp: 60 tablet, Rfl: 5   clonazePAM (KLONOPIN) 1 MG tablet, TAKE 1 TABLET BY MOUTH TWICE A DAY AS NEEDED, Disp: 60 tablet, Rfl: 0   FLUoxetine (PROZAC) 20 MG capsule, Take 1 capsule (20 mg total) by mouth daily., Disp: 90 capsule, Rfl: 1  Allergies  Allergen Reactions    Banana Hives   ROS neg/noncontributory except as noted HPI/below      Objective:     BP 110/76   Pulse 84   Temp 98.2 F (36.8 C) (Temporal)   Resp 16   Ht 6\' 2"  (1.88 m)   Wt 167 lb 2 oz (75.8 kg)   SpO2 98%   BMI 21.46 kg/m  Wt Readings from Last 3 Encounters:  06/05/23 167 lb 2 oz (75.8 kg)  11/02/22 175 lb (79.4 kg)  07/26/22 174 lb (78.9 kg)    Physical Exam   Gen: WDWN NAD HEENT: NCAT, conjunctiva not injected, sclera nonicteric NECK:  supple, no thyromegaly, no nodes, no carotid bruits CARDIAC: RRR, S1S2+, no murmur. DP 2+B LUNGS: CTAB. No wheezes MSK: no gross abnormalities.  NEURO: A&O x3.  CN II-XII intact.  PSYCH: normal mood. Good eye contact  PDMP reviewed today, no red flags.      Assessment & Plan:  Anxiety Assessment & Plan: Chronic.  Fair control.  Still a lot of stress and anxiety.  SE from prozac 40 so continue on 20mg .  Continue clonazepam 1mg  bid(PDMP checked).  Will add buspar 10mg  bid to see if better control as trying to avoid increasing clonazepam.  Needs to do UDS-couldn't go today.  Limiting refills until UDS done.    Orders: -  clonazePAM; TAKE 1 TABLET BY MOUTH TWICE A DAY AS NEEDED  Dispense: 60 tablet; Refill: 0 -     FLUoxetine HCl; Take 1 capsule (20 mg total) by mouth daily.  Dispense: 90 capsule; Refill: 1 -     busPIRone HCl; Take 1 tablet (10 mg total) by mouth 2 (two) times daily.  Dispense: 60 tablet; Refill: 5 -     DRUG MONITORING, PANEL 8 WITH CONFIRMATION, URINE; Future    Return in about 6 months (around 12/06/2023) for mood.   I,Rachel Rivera,acting as a scribe for Angelena Sole, MD.,have documented all relevant documentation on the behalf of Angelena Sole, MD,as directed by  Angelena Sole, MD while in the presence of Angelena Sole, MD.  I, Angelena Sole, MD, have reviewed all documentation for this visit. The documentation on 06/05/23 for the exam, diagnosis, procedures, and orders are all accurate and complete.   Angelena Sole, MD

## 2023-06-05 NOTE — Patient Instructions (Signed)
It was very nice to see you today! Try the buspar twice daily   PLEASE NOTE:  If you had any lab tests please let us know if you have not heard back within a few days. You may see your results on MyChart before we have a chance to review them but we will give you a call once they are reviewed by [place]. If we ordered any referrals today, please let us know if you have not heard from their office within the next week.   Please try these tips to maintain a healthy lifestyle:  Eat most of your calories during the day when you are active. Eliminate processed foods including packaged sweets (pies, cakes, cookies), reduce intake of potatoes, white bread, white pasta, and white rice. Look for whole grain options, oat flour or almond flour.  Each meal should contain half fruits/vegetables, one quarter protein, and one quarter carbs (no bigger than a computer mouse).  Cut down on sweet beverages. This includes juice, soda, and sweet tea. Also watch fruit intake, though this is a healthier sweet option, it still contains natural sugar! Limit to 3 servings daily.  Drink at least 1 glass of water with each meal and aim for at least 8 glasses per day  Exercise at least 150 minutes every week.

## 2023-06-27 ENCOUNTER — Other Ambulatory Visit: Payer: Self-pay | Admitting: Family Medicine

## 2023-06-27 DIAGNOSIS — F419 Anxiety disorder, unspecified: Secondary | ICD-10-CM

## 2023-07-03 ENCOUNTER — Other Ambulatory Visit: Payer: Self-pay | Admitting: Family Medicine

## 2023-07-03 ENCOUNTER — Other Ambulatory Visit: Payer: MEDICAID

## 2023-07-03 DIAGNOSIS — F419 Anxiety disorder, unspecified: Secondary | ICD-10-CM

## 2023-07-05 LAB — DM TEMPLATE

## 2023-07-06 NOTE — Progress Notes (Signed)
As expected.  Shouldn't smoke marijuana or take gummies w/THC

## 2023-12-18 ENCOUNTER — Other Ambulatory Visit: Payer: Self-pay | Admitting: Family Medicine

## 2023-12-18 DIAGNOSIS — F419 Anxiety disorder, unspecified: Secondary | ICD-10-CM

## 2023-12-18 MED ORDER — CLONAZEPAM 1 MG PO TABS: ORAL_TABLET | ORAL | 0 refills | Status: DC

## 2023-12-18 NOTE — Telephone Encounter (Signed)
Last OV: 06/05/23  Next OV: None  Last Filled: 07/03/23  Quantity: 60 w/ 5 refills

## 2023-12-18 NOTE — Telephone Encounter (Signed)
Needs appt

## 2023-12-18 NOTE — Telephone Encounter (Signed)
Copied from CRM [id number]. Topic: Clinical - Medication Refill >> Dec 18, 2023  1:36 PM Fredrich Romans wrote: Most Recent Primary Care Visit:  Provider: [facility]  Department: [facility]  Visit Type: LAB  Date: 07/03/2023  Medication: clonazePAM (KLONOPIN) 1 MG tablet  Has the patient contacted their pharmacy? Yes (Agent: If no, request that the patient contact the pharmacy for the refill. If patient does not wish to contact the pharmacy document the reason why and proceed with request.) (Agent: If yes, when and what did the pharmacy advise?)  Is this the correct pharmacy for this prescription? Yes If no, delete pharmacy and type the correct one.  This is the patient's preferred pharmacy:  [id number] [place], [place] - [address] [place] [address] Phone: [phone] Fax: [phone]   Has the prescription been filled recently? No  Is the patient out of the medication? No  Has the patient been seen for an appointment in the last year OR does the patient have an upcoming appointment? No  Can we respond through MyChart? Yes  Agent: Please be advised that Rx refills may take up to 3 business days. We ask that you follow-up with your pharmacy.

## 2023-12-21 ENCOUNTER — Ambulatory Visit (INDEPENDENT_AMBULATORY_CARE_PROVIDER_SITE_OTHER): Payer: MEDICAID | Admitting: Family Medicine

## 2023-12-21 ENCOUNTER — Encounter: Payer: Self-pay | Admitting: Family Medicine

## 2023-12-21 VITALS — BP 114/77 | HR 95 | Temp 97.5°F | Resp 18 | Ht 74.0 in | Wt 163.0 lb

## 2023-12-21 DIAGNOSIS — F419 Anxiety disorder, unspecified: Secondary | ICD-10-CM

## 2023-12-21 DIAGNOSIS — R634 Abnormal weight loss: Secondary | ICD-10-CM | POA: Diagnosis not present

## 2023-12-21 LAB — LIPID PANEL
Cholesterol: 298 mg/dL — ABNORMAL HIGH (ref 0–200)
HDL: 40.2 mg/dL (ref >39.00)
LDL Cholesterol: 200 mg/dL — ABNORMAL HIGH (ref 0–99)
NonHDL: 258.27
Total CHOL/HDL Ratio: 7
Triglycerides: 291 mg/dL — ABNORMAL HIGH (ref 0.0–149.0)
VLDL: 58.2 mg/dL — ABNORMAL HIGH (ref 0.0–40.0)

## 2023-12-21 LAB — COMPREHENSIVE METABOLIC PANEL
ALT: 17 U/L (ref 0–53)
AST: 20 U/L (ref 0–37)
Albumin: 5 g/dL (ref 3.5–5.2)
Alkaline Phosphatase: 53 U/L (ref 39–117)
BUN: 14 mg/dL (ref 6–23)
CO2: 32 meq/L (ref 19–32)
Calcium: 10.4 mg/dL (ref 8.4–10.5)
Chloride: 98 meq/L (ref 96–112)
Creatinine, Ser: 1.15 mg/dL (ref 0.40–1.50)
GFR: 82.58 mL/min (ref >60.00)
Glucose, Bld: 96 mg/dL (ref 70–99)
Potassium: 4.6 meq/L (ref 3.5–5.1)
Sodium: 143 meq/L (ref 135–145)
Total Bilirubin: 0.4 mg/dL (ref 0.2–1.2)
Total Protein: 7.8 g/dL (ref 6.0–8.3)

## 2023-12-21 LAB — HEMOGLOBIN A1C: Hgb A1c MFr Bld: 5.9 % (ref 4.6–6.5)

## 2023-12-21 LAB — CBC WITH DIFFERENTIAL/PLATELET
Basophils Absolute: 0 10*3/uL (ref 0.0–0.1)
Basophils Relative: 0.7 % (ref 0.0–3.0)
Eosinophils Absolute: 0.4 10*3/uL (ref 0.0–0.7)
Eosinophils Relative: 8 % — ABNORMAL HIGH (ref 0.0–5.0)
HCT: 41.4 % (ref 39.0–52.0)
Hemoglobin: 13.3 g/dL (ref 13.0–17.0)
Lymphocytes Relative: 53.2 % — ABNORMAL HIGH (ref 12.0–46.0)
Lymphs Abs: 3 10*3/uL (ref 0.7–4.0)
MCHC: 32.2 g/dL (ref 30.0–36.0)
MCV: 82.2 fL (ref 78.0–100.0)
Monocytes Absolute: 0.2 10*3/uL (ref 0.1–1.0)
Monocytes Relative: 4.1 % (ref 3.0–12.0)
Neutro Abs: 1.9 10*3/uL (ref 1.4–7.7)
Neutrophils Relative %: 34 % — ABNORMAL LOW (ref 43.0–77.0)
Platelets: 253 10*3/uL (ref 150.0–400.0)
RBC: 5.03 Mil/uL (ref 4.22–5.81)
RDW: 12.8 % (ref 11.5–15.5)
WBC: 5.6 10*3/uL (ref 4.0–10.5)

## 2023-12-21 LAB — TSH: TSH: 1.37 u[IU]/mL (ref 0.35–5.50)

## 2023-12-21 NOTE — Patient Instructions (Signed)

## 2023-12-21 NOTE — Progress Notes (Signed)
   Subjective:     Patient ID: Matthew Roach, male    DOB: 1986/07/12, 38 y.o.   MRN: [id number]  Chief Complaint  Patient presents with   Anxiety    Follow-up on anxiety    HPI Anxiety - Overall his mood has stabilized. Is taking Prozac 20 mg. Has noticed side effects with Prozac such as minor hair loss along hair line, but states overall it is helping well. Is taking Clonazepam as needed-1/day and occ twice when working or attending social events. States if he is not working or going to a social event he has not needed to take it and feels fine. When working, has noticed he feels he needs to take both doses closer together to help over-thinking and fidgety.  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  No SI Seizures - Endorses muscle spasms when he occasionally misses doses of clonazepam  Denies any seizures.  Weight-can't gain.  Not great appetite. Can go 2 days w/o eating if not aware.  Has stopped cbd.  No v/d.  Has been thin whole life.    There are no preventive care reminders to display for this patient.    Past Medical History:  Diagnosis Date   Anxiety    Depression    Seizures (HCC)     History reviewed. No pertinent surgical history.   Current Outpatient Medications:    clonazePAM (KLONOPIN) 1 MG tablet, TAKE 1 TABLET BY MOUTH TWICE A DAY AS NEEDED, Disp: 60 tablet, Rfl: 0   FLUoxetine (PROZAC) 20 MG capsule, Take 1 capsule (20 mg total) by mouth daily., Disp: 90 capsule, Rfl: 1  Allergies  Allergen Reactions   Banana Hives   ROS neg/noncontributory except as noted HPI/below      Objective:     BP 114/77   Pulse 95   Temp (!) 97.5 F (36.4 C) (Temporal)   Resp 18   Ht 6\' 2"  (1.88 m)   Wt 163 lb (73.9 kg)   SpO2 98%   BMI 20.93 kg/m  Wt Readings from Last 3 Encounters:  12/21/23 163 lb (73.9 kg)  06/05/23 167 lb 2 oz (75.8 kg)  11/02/22 175 lb (79.4 kg)    Physical Exam   Gen: WDWN NAD HEENT: NCAT,  conjunctiva not injected, sclera nonicteric NECK:  supple, no thyromegaly, no nodes, no carotid bruits CARDIAC: RRR, S1S2+, no murmur. DP 2+B LUNGS: CTAB. No wheezes MSK: no gross abnormalities.  NEURO: A&O x3.  CN II-XII intact.  PSYCH: normal mood. Good eye contact  PDMP reviewed today, no red flags.      Assessment & Plan:  Anxiety Assessment & Plan: Chronic.  Controlled.  Continue prozac 20mg .  Continue clonazepam 1mg  bid(PDMP checked).     Weight loss -     CBC with Differential/Platelet -     Comprehensive metabolic panel -     Hemoglobin A1c -     TSH -     Lipid panel  2.  Wt loss-since stopping cbd.  Stable for 6 mo.  Thin whole life.  Discussed healthy eating and suggestions for wt gain.  Check labs.     Return in about 6 months (around 06/19/2024) for mood.   Angelena Sole, MD

## 2023-12-21 NOTE — Assessment & Plan Note (Signed)
Chronic.  Controlled.  Continue prozac 20mg .  Continue clonazepam 1mg  bid(PDMP checked).

## 2023-12-22 ENCOUNTER — Other Ambulatory Visit: Payer: Self-pay | Admitting: *Deleted

## 2023-12-22 ENCOUNTER — Encounter: Payer: Self-pay | Admitting: Family Medicine

## 2023-12-22 DIAGNOSIS — E7800 Pure hypercholesterolemia, unspecified: Secondary | ICD-10-CM

## 2023-12-22 NOTE — Telephone Encounter (Signed)
Copied from CRM [id number]. Topic: Clinical - Lab/Test Results >> Dec 22, 2023 11:02 AM Corin V wrote: Reason for CRM: Patient returned missed call to the office. Please call patient back to review lab results.

## 2023-12-22 NOTE — Progress Notes (Signed)
Labs are ok except cholesterol is terrible and A1C still elevated(but acceptable).   Do labs in 3 months-lipids and f/u few days later as may need meds for this.

## 2023-12-22 NOTE — Telephone Encounter (Signed)
Copied from CRM [id number]. Topic: Clinical - Lab/Test Results >> Dec 22, 2023 11:45 AM Truddie Crumble wrote: Reason for CRM: patient called back regarding lab results. I read the message to the patient and he stated he was eating junk food that day so that may be a cause for the lab results. Patent stated he will check with his work schedule and call back to make an appointment  Noted.

## 2023-12-22 NOTE — Telephone Encounter (Signed)
Copied from CRM [id number]. Topic: Clinical - Lab/Test Results >> Dec 22, 2023 11:02 AM Corin V wrote: Reason for CRM: Patient returned missed call to the office. Please call patient back to review lab results.  Left message for patient to return call. Lab orders have been placed. Please schedule patient for lab visit for 3 months and ov a few days later. Please give patient message below. Thanks  Labs are ok except cholesterol is terrible and A1C still elevated(but acceptable). Do labs in 3 months-lipids and f/u few days later as may need meds for this.

## 2024-01-19 ENCOUNTER — Other Ambulatory Visit: Payer: Self-pay | Admitting: Family Medicine

## 2024-01-19 DIAGNOSIS — F419 Anxiety disorder, unspecified: Secondary | ICD-10-CM

## 2024-01-19 NOTE — Telephone Encounter (Signed)
 Needs to sch appt for end of July

## 2024-01-22 ENCOUNTER — Ambulatory Visit
Admission: EM | Admit: 2024-01-22 | Discharge: 2024-01-22 | Disposition: A | Discharge status: Home / Self Care | Payer: MEDICAID | Attending: Family Medicine | Admitting: Family Medicine

## 2024-01-22 ENCOUNTER — Emergency Department (HOSPITAL_BASED_OUTPATIENT_CLINIC_OR_DEPARTMENT_OTHER)
Admission: EM | Admit: 2024-01-22 | Discharge: 2024-01-22 | Discharge status: Against Medical Advice | Payer: MEDICAID | Attending: Emergency Medicine | Admitting: Emergency Medicine

## 2024-01-22 ENCOUNTER — Encounter (HOSPITAL_BASED_OUTPATIENT_CLINIC_OR_DEPARTMENT_OTHER): Payer: Self-pay

## 2024-01-22 ENCOUNTER — Other Ambulatory Visit: Payer: Self-pay

## 2024-01-22 ENCOUNTER — Encounter: Payer: Self-pay | Admitting: Emergency Medicine

## 2024-01-22 DIAGNOSIS — G4700 Insomnia, unspecified: Secondary | ICD-10-CM

## 2024-01-22 DIAGNOSIS — Z5321 Procedure and treatment not carried out due to patient leaving prior to being seen by health care provider: Secondary | ICD-10-CM | POA: Diagnosis not present

## 2024-01-22 DIAGNOSIS — R34 Anuria and oliguria: Secondary | ICD-10-CM | POA: Diagnosis not present

## 2024-01-22 NOTE — ED Notes (Addendum)
 Pt notified this medic that he was going to leave before being seen by the provider... Pt stated that he was able to make an appointment for his PCP for tmrw... Pt was AO x4... Pt notified that the provider had just signed up for him and would be over as soon as they could... Pt aware and still going to leave... Pt left with family.... Provider aware and RN notified.Marland KitchenMarland Kitchen

## 2024-01-22 NOTE — ED Provider Notes (Signed)
 [facility]    CSN: [id number] Arrival date & time: 01/22/24  1130      History   Chief Complaint No chief complaint on file.   HPI Matthew Roach is a 38 y.o. male presents for insomnia and dehydration.  Patient reports he was recently started on Prozac about a month ago.  States 3 days ago he accidentally took a second dose.  He takes 20 mg daily.  He states he has been unable to sleep for the past 72 hours.  He also reports he has not been able to eat and can barely drink.  States he has had 8 ounces of water in 24 hours and has not urinated in the past 24 hours.  He does report dry mouth and dry eyes, extreme fatigue.  He states his anxiety seems to be increasing as well.  He has taken melatonin without improvement in his symptoms.  He denies any suicidal homicidal ideations.  HPI  Past Medical History:  Diagnosis Date   Anxiety    Depression    Seizures [facility])     Patient Active Problem List   Diagnosis Date Noted   Anxiety 04/13/2022    History reviewed. No pertinent surgical history.     Home Medications    Prior to Admission medications   Medication Sig Start Date End Date Taking? Authorizing Provider  clonazePAM (KLONOPIN) 1 MG tablet TAKE 1 TABLET BY MOUTH TWICE A DAY AS NEEDED 01/19/24   Jeani Sow, MD  FLUoxetine (PROZAC) 20 MG capsule Take 1 capsule (20 mg total) by mouth daily. 06/05/23   Jeani Sow, MD    Family History Family History  Problem Relation Age of Onset   Diabetes Mother    Hypertension Father    Hyperlipidemia Father    Heart disease Father    Diabetes Father     Social History Social History   Tobacco Use   Smoking status: Never   Smokeless tobacco: Never  Vaping Use   Vaping status: Former   Substances: Nicotine  Substance Use Topics   Alcohol use: No   Drug use: Not Currently    Types: Marijuana    Comment: in past     Allergies   Banana   Review of Systems Review of Systems  Constitutional:   Positive for fatigue.  Genitourinary:  Positive for decreased urine volume.  Psychiatric/Behavioral:  Positive for sleep disturbance. The patient is nervous/anxious.      Physical Exam Triage Vital Signs ED Triage Vitals  Encounter Vitals Group     BP 01/22/24 1141 117/78     Systolic BP Percentile --      Diastolic BP Percentile --      Pulse Rate 01/22/24 1141 85     Resp 01/22/24 1141 14     Temp 01/22/24 1141 98.5 F (36.9 C)     Temp Source 01/22/24 1141 Oral     SpO2 01/22/24 1141 95 %     Weight --      Height --      Head Circumference --      Peak Flow --      Pain Score 01/22/24 1140 0     Pain Loc --      Pain Education --      Exclude from Growth Chart --    No data found.  Updated Vital Signs BP 117/78 (BP Location: Right Arm)   Pulse 85   Temp 98.5 F (36.9 C) (  Oral)   Resp 14   SpO2 95%   Visual Acuity Right Eye Distance:   Left Eye Distance:   Bilateral Distance:    Right Eye Near:   Left Eye Near:    Bilateral Near:     Physical Exam Vitals and nursing note reviewed.  Constitutional:      General: He is not in acute distress.    Appearance: He is not ill-appearing or toxic-appearing.     Comments: Appears tired  HENT:     Head: Normocephalic and atraumatic.  Eyes:     Conjunctiva/sclera: Conjunctivae normal.     Pupils: Pupils are equal, round, and reactive to light.  Cardiovascular:     Rate and Rhythm: Normal rate and regular rhythm.     Heart sounds: Normal heart sounds.  Pulmonary:     Effort: Pulmonary effort is normal.     Breath sounds: Normal breath sounds.  Skin:    General: Skin is warm and dry.  Neurological:     General: No focal deficit present.     Mental Status: He is alert and oriented to person, place, and time.  Psychiatric:     Comments: Patient is somewhat anxious but makes eye contact and answers questions appropriately.      UC Treatments / Results  Labs (all labs ordered are listed, but only abnormal  results are displayed) Labs Reviewed - No data to display  EKG   Radiology No results found.  Procedures Procedures (including critical care time)  Medications Ordered in UC Medications - No data to display  Initial Impression / Assessment and Plan / UC Course  I have reviewed the triage vital signs and the nursing notes.  Pertinent labs & imaging results that were available during my care of the patient were reviewed by me and considered in my medical decision making (see chart for details).     I reviewed limitations and abilities of urgent care.  Primary concern is patient hydration status is reports he has not urinated in 24 hours.  Also concern of his insomnia with no reported sleep of any length in the past 72 hours.  Given the symptoms I advised to go to the ER for further evaluation and treatment.  He is in agreement we will plan and will go POV with his mother to the emergency room. Final Clinical Impressions(s) / UC Diagnoses   Final diagnoses:  Insomnia, unspecified type  Decreased urine output     Discharge Instructions      Please go to the ER for further workup of your symptoms     ED Prescriptions   None    PDMP not reviewed this encounter.   Radford Pax, NP 01/22/24 [phone]

## 2024-01-22 NOTE — Discharge Instructions (Signed)
 Please go to the ER for further workup of your symptoms

## 2024-01-22 NOTE — ED Triage Notes (Signed)
 Patient states he accidentally took a second prozac an hour after he had taken a dose that morning. He states he also took a melatonin with it. Since then he has had "Extreme" insomnia and hasn't been able to sleep in 2-3 days.

## 2024-01-22 NOTE — ED Triage Notes (Signed)
 Pt c/o insomnia x3 days. On prozac, states he took too much 2-3 days ago- "I took one & then took another w my other medications; I'm supposed to take 20mg , probably took 25." Seen at [facility] for same, sent in case he needs further workup

## 2024-01-24 ENCOUNTER — Encounter (HOSPITAL_COMMUNITY): Payer: Self-pay

## 2024-01-24 ENCOUNTER — Emergency Department (HOSPITAL_COMMUNITY)
Admission: EM | Admit: 2024-01-24 | Discharge: 2024-01-25 | Disposition: A | Discharge status: Other Acute Inpatient Hospital | Payer: MEDICAID | Attending: Emergency Medicine | Admitting: Emergency Medicine

## 2024-01-24 ENCOUNTER — Other Ambulatory Visit: Payer: Self-pay

## 2024-01-24 DIAGNOSIS — R440 Auditory hallucinations: Secondary | ICD-10-CM | POA: Diagnosis present

## 2024-01-24 DIAGNOSIS — F419 Anxiety disorder, unspecified: Secondary | ICD-10-CM | POA: Diagnosis not present

## 2024-01-24 DIAGNOSIS — F22 Delusional disorders: Secondary | ICD-10-CM | POA: Diagnosis present

## 2024-01-24 DIAGNOSIS — F29 Unspecified psychosis not due to a substance or known physiological condition: Secondary | ICD-10-CM | POA: Diagnosis not present

## 2024-01-24 DIAGNOSIS — F23 Brief psychotic disorder: Secondary | ICD-10-CM

## 2024-01-24 LAB — BASIC METABOLIC PANEL
Anion gap: 9 (ref 5–15)
BUN: 14 mg/dL (ref 6–20)
CO2: 30 mmol/L (ref 22–32)
Calcium: 9.8 mg/dL (ref 8.9–10.3)
Chloride: 99 mmol/L (ref 98–111)
Creatinine, Ser: 0.77 mg/dL (ref 0.61–1.24)
GFR, Estimated: >60 mL/min (ref >60)
Glucose, Bld: 96 mg/dL (ref 70–99)
Potassium: 4.1 mmol/L (ref 3.5–5.1)
Sodium: 138 mmol/L (ref 135–145)

## 2024-01-24 LAB — CBC WITH DIFFERENTIAL/PLATELET
Abs Immature Granulocytes: 0.01 10*3/uL (ref 0.00–0.07)
Basophils Absolute: 0 10*3/uL (ref 0.0–0.1)
Basophils Relative: 1 %
Eosinophils Absolute: 0.2 10*3/uL (ref 0.0–0.5)
Eosinophils Relative: 4 %
HCT: 43 % (ref 39.0–52.0)
Hemoglobin: 13.4 g/dL (ref 13.0–17.0)
Immature Granulocytes: 0 %
Lymphocytes Relative: 42 %
Lymphs Abs: 2.7 10*3/uL (ref 0.7–4.0)
MCH: 26.1 pg (ref 26.0–34.0)
MCHC: 31.2 g/dL (ref 30.0–36.0)
MCV: 83.7 fL (ref 80.0–100.0)
Monocytes Absolute: 0.3 10*3/uL (ref 0.1–1.0)
Monocytes Relative: 5 %
Neutro Abs: 3 10*3/uL (ref 1.7–7.7)
Neutrophils Relative %: 48 %
Platelets: 256 10*3/uL (ref 150–400)
RBC: 5.14 MIL/uL (ref 4.22–5.81)
RDW: 12.8 % (ref 11.5–15.5)
WBC: 6.3 10*3/uL (ref 4.0–10.5)
nRBC: 0 % (ref 0.0–0.2)

## 2024-01-24 LAB — RAPID URINE DRUG SCREEN, HOSP PERFORMED
Amphetamines: NOT DETECTED
Barbiturates: NOT DETECTED
Benzodiazepines: POSITIVE — AB
Cocaine: NOT DETECTED
Opiates: NOT DETECTED
Tetrahydrocannabinol: POSITIVE — AB

## 2024-01-24 LAB — TSH: TSH: 0.965 u[IU]/mL (ref 0.350–4.500)

## 2024-01-24 MED ORDER — MELATONIN 3 MG PO TABS
3.0000 mg | ORAL_TABLET | Freq: Every day | ORAL | Status: DC
  Administered 2024-01-24: 3 mg via ORAL
  Filled 2024-01-24: qty 1

## 2024-01-24 MED ORDER — CLONAZEPAM 0.5 MG PO TBDP
1.0000 mg | ORAL_TABLET | Freq: Two times a day (BID) | ORAL | Status: DC
  Administered 2024-01-24 – 2024-01-25 (×2): 1 mg via ORAL
  Filled 2024-01-24 (×2): qty 2

## 2024-01-24 MED ORDER — METHADONE HCL 10 MG PO TABS
80.0000 mg | ORAL_TABLET | Freq: Every day | ORAL | Status: DC
  Filled 2024-01-24: qty 8

## 2024-01-24 MED ORDER — CLONAZEPAM 0.5 MG PO TABS
1.0000 mg | ORAL_TABLET | Freq: Once | ORAL | Status: AC
  Administered 2024-01-24: 1 mg via ORAL
  Filled 2024-01-24: qty 2

## 2024-01-24 MED ORDER — DIVALPROEX SODIUM 250 MG PO DR TAB
250.0000 mg | DELAYED_RELEASE_TABLET | Freq: Every day | ORAL | Status: DC
  Administered 2024-01-24: 250 mg via ORAL
  Filled 2024-01-24: qty 1

## 2024-01-24 MED ORDER — METHADONE HCL 10 MG PO TABS
80.0000 mg | ORAL_TABLET | Freq: Once | ORAL | Status: AC
  Administered 2024-01-24: 80 mg via ORAL
  Filled 2024-01-24: qty 8

## 2024-01-24 MED ORDER — METHADONE HCL 10 MG PO TABS
80.0000 mg | ORAL_TABLET | Freq: Once | ORAL | Status: AC
  Administered 2024-01-24: 80 mg via ORAL

## 2024-01-24 MED ORDER — LORAZEPAM 1 MG PO TABS
1.0000 mg | ORAL_TABLET | Freq: Once | ORAL | Status: AC
  Administered 2024-01-24: 1 mg via ORAL
  Filled 2024-01-24: qty 1

## 2024-01-24 MED ORDER — RISPERIDONE 0.5 MG PO TBDP
1.0000 mg | ORAL_TABLET | Freq: Two times a day (BID) | ORAL | Status: DC
  Administered 2024-01-24 – 2024-01-25 (×2): 1 mg via ORAL
  Filled 2024-01-24 (×2): qty 2

## 2024-01-24 NOTE — ED Notes (Addendum)
 Nurse to room to provide patient with warm blankets and pillows. Nurse readjusted the bed for comfort. Patient wanting to leave at this time. Nurse speaking with patient politley and explaining to him that he is voluntary but will be IVC'd if he tries to leave. Patient stated "I need to go outside to get some air I can't breath in here" Nurse politely informed patient that he cannot go outside at this time. Patient and family requested to speak with provider.

## 2024-01-24 NOTE — ED Provider Notes (Signed)
 [place] EMERGENCY DEPARTMENT AT [facility] Provider Note   CSN: [id number] Arrival date & time: 01/24/24  1038     History Chief Complaint  Patient presents with   Psychiatric Evaluation    HPI Matthew Roach is a 38 y.o. male presenting for chief complaint of psychiatric evaluation. He is a 38 year old male with a minimal medical history.  Diagnosed with depression on Prozac and Klonopin as needed. States has been using his Klonopin twice daily as prescribed but has not slept in 5 days. Started having auditory hallucinations this morning.  Family member noticed him responding internal stimuli brings him in for voluntary evaluation. Patient states he wants assistance at this time..   Patient's recorded medical, surgical, social, medication list and allergies were reviewed in the Snapshot window as part of the initial history.   Review of Systems   Review of Systems  Constitutional:  Negative for chills and fever.  HENT:  Negative for ear pain and sore throat.   Eyes:  Negative for pain and visual disturbance.  Respiratory:  Negative for cough and shortness of breath.   Cardiovascular:  Negative for chest pain and palpitations.  Gastrointestinal:  Negative for abdominal pain and vomiting.  Genitourinary:  Negative for dysuria and hematuria.  Musculoskeletal:  Negative for arthralgias and back pain.  Skin:  Negative for color change and rash.  Neurological:  Negative for seizures and syncope.  Psychiatric/Behavioral:  Positive for agitation, hallucinations and sleep disturbance.   All other systems reviewed and are negative.   Physical Exam Updated Vital Signs BP (!) 136/90 (BP Location: Right Arm)   Pulse 81   Temp 98.3 F (36.8 C) (Oral)   Resp 20   Ht 6\' 2"  (1.88 m)   Wt 73.9 kg   SpO2 100%   BMI 20.92 kg/m  Physical Exam Vitals and nursing note reviewed.  Constitutional:      General: He is not in acute distress.    Appearance: He is  well-developed.  HENT:     Head: Normocephalic and atraumatic.  Eyes:     Conjunctiva/sclera: Conjunctivae normal.  Cardiovascular:     Rate and Rhythm: Normal rate and regular rhythm.  Pulmonary:     Effort: Pulmonary effort is normal. No respiratory distress.  Abdominal:     General: Abdomen is flat. There is no distension.  Musculoskeletal:        General: No swelling or deformity.  Skin:    General: Skin is warm and dry.     Capillary Refill: Capillary refill takes less than 2 seconds.  Neurological:     Mental Status: He is alert and oriented to person, place, and time. Mental status is at baseline.      ED Course/ Medical Decision Making/ A&P    Procedures Procedures   Medications Ordered in ED Medications - No data to display Medical Decision Making:   Christie Copley is a 38 y.o. male who presented to the ED today for psychiatric evaluation.  Patient is endorsing AH.  Patient does have a history of A/D for which they are on multiple medications.  They are compliant with their medications.  On my initial exam, the pt was linear in thought, paranoid in affect, and overall well-appearing.  Vital signs reviewed and reassuring.  Reviewed and confirmed nursing documentation for past medical history, family history, social history.   Additional history discussed with patient's family/caregivers.     Initial Assessment:   This is most consistent with an  acute life threatening illness. With the patient's presentation of auditory hallucinations in the setting of worsening insomnia, patient warrants emergent psychiatric consultation.  Differential includes primary psychosis, substance-induced psychosis, mood disturbance.  Initial Plan:  Patient immediately placed into ED psychiatric hold protocol including suicide precautions, elopement precautions and vital sign monitoring.    Emergent behavioral health hold signed and notarized while awaiting psychiatric consultation due to  threat to self or others.  Psychiatry consulted for further evaluation once patient medically cleared.  Medical screening evaluation ordered and reviewed with no obvious medical reason to postpone psychiatric evaluation.  Patient is voluntary at this time.  No IVC.  May need to be reassessed if capacity is changing.     Final Assessment and Plan:   Patient placed in a psychiatric hold protocol pending their recommendations for psychiatric disposition.  Initial blood work reviewed and patient is medically cleared for psychiatric disposition at this time.      Clinical Impression:  1. Acute psychosis (HCC)      Data Unavailable   Final Clinical Impression(s) / ED Diagnoses Final diagnoses:  Acute psychosis (HCC)    Rx / DC Orders ED Discharge Orders     None         Glyn Ade, MD 01/24/24 1324

## 2024-01-24 NOTE — Consult Note (Signed)
 [facility] Psychiatric Consult Initial  Patient Name: .Matthew Roach  MRN: [id number]  DOB: 06/01/1986  Consult Order details:  Orders (From admission, onward)     Start     Ordered   01/24/24 1322  CONSULT TO CALL ACT TEAM       Ordering Provider: Glyn Ade, MD  Provider:  (Not yet assigned)  Question:  Reason for Consult?  Answer:  Psych consult   01/24/24 1321             Mode of Visit: In person    Psychiatry Consult Evaluation  Service Date: January 24, 2024 LOS:  LOS: 0 days  Chief Complaint "I don't know if I'm having a panic attack or what"  Primary Psychiatric Diagnoses  psychosis 2.  anxiety   Assessment  Matthew Roach is a 38 y.o. male admitted: Presented to the EDfor 01/24/2024 11:22 AM for brought in by brother complaining of insomnia for 3-4 days. He carries the psychiatric diagnoses of GAD, psychosis and has a past medical history of opioid use disorder.   His current presentation of psychosis and mania is most consistent with likely bipolar disorder. He meets criteria for inpatient psychiatric hospitalization based on manic presentation with active psychosis.  Current outpatient psychotropic medications include prozac and klonopin and historically he has had a lack luster response to these medications. He was compliant with medications prior to admission as evidenced by report that his mother has been managing them and he takes them daily. On initial examination, patient is cooperative, hyper-verbal and unable to sit still. Please see plan below for detailed recommendations.   Diagnoses:  Active Hospital problems: Principal Problem:   Psychosis (HCC)    Plan   ## Psychiatric Medication Recommendations:  --depakote 250mg  PO Q HS --risperdal 1mg  PO BID --klonopin 1mg  PO BID --methadone 1mg  PO Q day  ## Medical Decision Making Capacity: Not specifically addressed in this encounter  ## Further Work-up:  -- Pending TSH T3 and T4 -- most  recent EKG on 01/24/2024 had QtC of 418 -- Pertinent labwork reviewed earlier this admission includes: CBC, CMP and UDS   ## Disposition:-- We recommend inpatient psychiatric hospitalization when medically cleared. Patient is under voluntary admission status at this time; please IVC if attempts to leave hospital.  ## Behavioral / Environmental: -Utilize compassion and acknowledge the patient's experiences while setting clear and realistic expectations for care.    ## Safety and Observation Level:  - Based on my clinical evaluation, I estimate the patient to be at low risk of self harm in the current setting. - At this time, we recommend  routine. This decision is based on my review of the chart including patient's history and current presentation, interview of the patient, mental status examination, and consideration of suicide risk including evaluating suicidal ideation, plan, intent, suicidal or self-harm behaviors, risk factors, and protective factors. This judgment is based on our ability to directly address suicide risk, implement suicide prevention strategies, and develop a safety plan while the patient is in the clinical setting. Please contact our team if there is a concern that risk level has changed.  CSSR Risk Category:C-SSRS RISK CATEGORY: No Risk  Suicide Risk Assessment: Patient has following modifiable risk factors for suicide: none, which we are addressing by recommending inpatient psychiatric hospitalization. Patient has following non-modifiable or demographic risk factors for suicide: male gender Patient has the following protective factors against suicide: Supportive family  Thank you for this consult request. Recommendations have been communicated  to the primary team.  We will continue to follow at this time.   Thomes Lolling, NP       History of Present Illness  Relevant Aspects of Hospital ED Course:  Admitted on 01/24/2024 for brought in by brother complaining of insomnia  for 3-4 days. He carries the psychiatric diagnoses of GAD, psychosis and has a past medical history of opioid use disorder.    Patient Report:  Matthew Roach, is seen face to face by this provider, consulted with Dr. Enedina Finner; and chart reviewed on 01/24/24.  On evaluation Matthew Roach reports he is hearing voices and having panic attacks.  Patient is hyper verbal and says he had not slept for 3 or 4 days and last night ony slept for approximately 3 hours.  Patient reports a history of drug use and says he currently takes klonopin bid for anxiety and methadone to treat his opioid use disorder. Patient says he wants to understand what is wrong with him, get treated and he would like to stop using the benzodiazepines and the methadone.   Patient lives at home with his parents. He has 4 adult siblings, 2 brothers and 2 sisters, three of which live locally.  He is employed as a Photographer at [employer].    Patient reports a history of seizures when trying to stop the benzodiazepines.    During evaluation Matthew Roach is sitting on a stretcher in moderate distress.  He is alert & oriented x 4, anxious cooperative and attentive for this assessment.  His mood is anxious with congruent affect.  He has pressured speech, and anxious behavior; his face is quivering during the interview.  Objectively there is evidence of psychosis and mania and paranoid thinking. Pt does not appear to be responding to internal or external stimuli.  Patient struggles to converse coherently; he is very tangential.  He denies suicidal/self-harm/homicidal ideation; he endorses psychosis and paranoia.  Patient answered questions appropriately.    Psych ROS:  Depression: endorses Anxiety:  endorses Mania (lifetime and current): current Psychosis: (lifetime and current): current  Collateral information:  Brother is available during interview to provide collateral information.  Review of Systems  Constitutional:  Positive  for weight loss.  Neurological:  Positive for tremors.  Psychiatric/Behavioral:  Positive for hallucinations.        Mania  All other systems reviewed and are negative.    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 Legal Hx: none Living Situation: lives with parents Spiritual Hx: none noted Access to weapons/lethal means: denies   Substance History Alcohol: denies  Tobacco: denies Illicit drugs: denies UDS +THC Prescription drug abuse: klonopin Rehab hx: none  Exam Findings  Physical Exam:  Vital Signs:  Temp:  [98.3 F (36.8 C)] 98.3 F (36.8 C) (03/05 1124) Pulse Rate:  [80-81] 80 (03/05 1539) Resp:  [18-20] 18 (03/05 1539) BP: (129-136)/(90-94) 129/94 (03/05 1539) SpO2:  [98 %-100 %] 98 % (03/05 1539) Weight:  [73.9 kg] 73.9 kg (03/05 1129) Blood pressure (!) 129/94, pulse 80, temperature 98.3 F (36.8 C), temperature  source Oral, resp. rate 18, height 6\' 2"  (1.88 m), weight 73.9 kg, SpO2 98%. Body mass index is 20.92 kg/m.  Physical Exam Vitals and nursing note reviewed.  Eyes:     Pupils: Pupils are equal, round, and reactive to light.  Pulmonary:     Effort: Pulmonary effort is normal.  Skin:    General: Skin is dry.  Neurological:     Mental Status: He is alert and oriented to person, place, and time.  Psychiatric:        Attention and Perception: He perceives auditory and visual hallucinations.        Mood and Affect: Mood is anxious.        Speech: Speech is rapid and pressured.         Behavior: Behavior is agitated. Behavior is cooperative.        Thought Content: Thought content is paranoid.        Judgment: Judgment is impulsive.     Mental Status Exam: General Appearance: Casual  Orientation:  Full (Time, Place, and Person)  Memory:  Immediate;   Good Recent;   Good Remote;   Good  Concentration:  Concentration: Good  Recall:  Good  Attention  Good  Eye Contact:  Good  Speech:  Pressured  Language:  Good  Volume:  Normal  Mood: anxious  Affect:  Congruent  Thought Process:  Disorganized  Thought Content:  Hallucinations: Auditory Visual and Paranoid Ideation  Suicidal Thoughts:  No  Homicidal Thoughts:  No  Judgement:  Impaired  Insight:  Shallow  Psychomotor Activity:  Restlessness  Akathisia:  No  Fund of Knowledge:  Good      Assets:  Communication Skills Desire for Improvement Housing Social Support  Cognition:  WNL  ADL's:  Intact  AIMS (if indicated):        Other History   These have been pulled in through the EMR, reviewed, and updated if appropriate.  Family History:  The patient's family history includes Diabetes in his father and mother; Heart disease in his father; Hyperlipidemia in his father; Hypertension in his father.  Medical History: Past Medical History:  Diagnosis Date  . Anxiety   . Depression   . Seizures (HCC)     Surgical History: History reviewed. No pertinent surgical history.   Medications:   Current Facility-Administered Medications:  .  clonazePAM (KLONOPIN) tablet 1 mg, 1 mg, Oral, Once, Countryman, Chase, MD  Current Outpatient Medications:  .  clonazePAM (KLONOPIN) 1 MG tablet, TAKE 1 TABLET BY MOUTH TWICE A DAY AS NEEDED, Disp: 60 tablet, Rfl: 5 .  FLUoxetine (PROZAC) 20 MG capsule, Take 1 capsule (20 mg total) by mouth daily., Disp: 90 capsule, Rfl: 1  Allergies: No Known Allergies  Thomes Lolling, NP

## 2024-01-24 NOTE — Progress Notes (Addendum)
 LCSW Progress Note:   Matthew Roach  MRN: [id number]  01/24/2024 7:42 PM   Per [facility] provider Phebe Colla, NP, inpatient psychiatric hospitalization is recommended after medical clearance. The patient is currently under voluntary admission, and in the event of any attempts to leave the hospital, an involuntary commitment (IVC) should be initiated. According to [facility]) [facility], Rona Ravens, RN, no appropriate [facility] beds are available at this time. The clinician has advised faxing the patient's information to alternative hospitals for consideration of bed placement.  See hospitals patient faxed to below:   Destination  Service Provider   Address Phone Fax Patient Preferred  [facility]   [address], [place] [address] [phone] --  [facility] Patient Placement   [facility], [place] [phone] --  [facility]   [address] [place] [address] [phone] --  [facility]   [address] [place] [address] [phone] --  [facility] [address]   [address], [place] [address] [phone] --  [facility]   [address] [place], [place] [address] [phone] --  [facility]   [address] [place], [place] [address] [phone] --  [facility]   [address] [place] [address] [phone] --  [facility]   [address] [place] [address] [phone] --  [facility]   [address] [place] [address] [phone] --  [facility]   [address], [place] [address] [phone] --  [facility]   [address] [place] [address] [phone] --  [facility]   [address] [place] [address] [phone] [facility]   [place] [phone] --  [facility]   [address] [place] [address] [phone] --  [address] [facility]   [address] [place], [place] [phone] --  [facility]   [address], [place] [address] [phone] --  [facility]   [address], [place] [address] [phone] --  [facility]   [address], [place] [address] [phone] --  [facility]   [address] [place] [address] [phone] --  [facility]   [address] [place] [address] [phone] --  [facility]   [address], [place] [address] [phone] --

## 2024-01-24 NOTE — ED Notes (Signed)
 Nurse requested for patient medications for anxiety and sleep. No new orders at this time.

## 2024-01-24 NOTE — ED Notes (Signed)
 Patient brother left at this time to get himself food. Patient in room comfortable at this time with warm blankets,pillows and lights dimmed at this time.

## 2024-01-24 NOTE — ED Triage Notes (Signed)
 Pt reports insomnia for 3-4 days. States he has been on psychiatric medications for 10 years and he thinks he was misdiagnosed and is not on the correct medication. Has been hearing voices that are saying negative things about him, but not telling him to hurt himself. Denies SI/HI.

## 2024-01-24 NOTE — ED Notes (Signed)
 Pt aware that we need a urine sample. Pts brother stated he was going to get his drink from the car for him to be able to urinate soon.

## 2024-01-24 NOTE — ED Notes (Addendum)
 Nurse spoke with family and patient. Patient family has a concern about the process  and patient having to be here in the hospital until he has a bed available. Patient family concern is that patient has a hx of trauma in the hospital and him being here sitting in a room is not helping him its only making him worse.  Nurse spoke with provider and provider reached out to psychiatrist  to see if he/she can speak with patient and patient family.Psychiatrist replied " patient is voluntary but will be IVC if he tries to leave and he/she is not longer here at the hospital to be able to speak with patient at this time. Patient verbalized he needs something to help him sleep and for anxiety. Patient stated "I feel like I'm going in and out of anxiety attacks" Patient family does not want to leave patient due to not feeling like he is comfortable at this time and doesn't want to leave and patient feels worse without his presence. Nurse reassured patient and family that we will do our best to get him comfortable so family can feel comfortable enough to leave him here in the hospital knowing he is safe,comfortable and being well taken care of.

## 2024-01-25 LAB — T4: T4, Total: 12.3 ug/dL — ABNORMAL HIGH (ref 4.5–12.0)

## 2024-01-25 LAB — T3: T3, Total: 136 ng/dL (ref 71–180)

## 2024-01-25 NOTE — ED Notes (Signed)
Report called to [facility] 

## 2024-01-25 NOTE — ED Notes (Signed)
Safe transport called 

## 2024-01-25 NOTE — ED Notes (Signed)
 Patient initially refused to go to [facility] and take meds. Able to convinced patient that treatment was needed with brother at bedside.

## 2024-01-25 NOTE — ED Notes (Addendum)
 Call from [facility], contact number [phone], [facility] stated that Matthew Roach is under consideration.   Also call from [facility] Matthew Zaden has been accepted by Dr Loni Beckwith and can come after 0900. Report can be called to [phone].

## 2024-01-25 NOTE — ED Provider Notes (Signed)
 Emergency Medicine Observation Re-evaluation Note  Matthew Roach is a 38 y.o. male, seen on rounds today.  Pt initially presented to the ED for complaints of Psychiatric Evaluation Currently, the patient is resting  Physical Exam  BP 94/62 (BP Location: Left Arm)   Pulse 98   Temp 97.6 F (36.4 C) (Oral)   Resp 18   Ht 6\' 2"  (1.88 m)   Wt 73.9 kg   SpO2 99%   BMI 20.92 kg/m  Physical Exam General: NAD Cardiac: RR Lungs: Clear  Psych: Calm   ED Course / MDM  EKG:   I have reviewed the labs performed to date as well as medications administered while in observation.  Recent changes in the last 24 hours include evaluated by tts/psych  Plan  Current plan is for inpatient psych; Currently voluntary, IVC if tries to leave per psych note.     Matthew Spikes, DO 01/25/24 [phone]

## 2024-03-14 ENCOUNTER — Encounter: Payer: Self-pay | Admitting: *Deleted

## 2024-03-14 ENCOUNTER — Other Ambulatory Visit: Payer: Self-pay | Admitting: Family Medicine

## 2024-03-14 DIAGNOSIS — F419 Anxiety disorder, unspecified: Secondary | ICD-10-CM

## 2024-03-14 NOTE — Telephone Encounter (Signed)
 Patient notified of message below.

## 2024-03-14 NOTE — Telephone Encounter (Signed)
 Seeing psych now and needs to get from them

## 2024-05-22 ENCOUNTER — Telehealth: Payer: Self-pay

## 2024-05-22 NOTE — Telephone Encounter (Signed)
 Copied from CRM [id number]. Topic: Appointments - Scheduling Inquiry for Clinic >> May 20, 2024  9:59 AM Berneda FALCON wrote: Reason for CRM: Patient would like to see Dr. Kennyth as a new patient. His brother and family all see Dr. Kennyth and highly recommended him for this patient.  Please cal brother Leotha) at [phone] with a response.

## 2024-05-22 NOTE — Telephone Encounter (Signed)
 That is fine.  Worth HERO. Kennyth, MD 05/22/2024 7:41 AM

## 2024-06-24 ENCOUNTER — Ambulatory Visit (INDEPENDENT_AMBULATORY_CARE_PROVIDER_SITE_OTHER): Payer: MEDICAID | Admitting: Family Medicine

## 2024-06-24 ENCOUNTER — Encounter: Payer: Self-pay | Admitting: Family Medicine

## 2024-06-24 DIAGNOSIS — F419 Anxiety disorder, unspecified: Secondary | ICD-10-CM

## 2024-06-24 MED ORDER — FLUOXETINE HCL 20 MG PO CAPS: 20.0000 mg | ORAL_CAPSULE | Freq: Every day | ORAL | 1 refills | Status: DC

## 2024-06-24 MED ORDER — QUETIAPINE FUMARATE 50 MG PO TABS: 50.0000 mg | ORAL_TABLET | Freq: Every day | ORAL | 3 refills | Status: DC

## 2024-06-24 MED ORDER — DIAZEPAM 10 MG PO TABS: 20.0000 mg | ORAL_TABLET | Freq: Two times a day (BID) | ORAL | 5 refills | Status: DC | PRN

## 2024-06-24 NOTE — Patient Instructions (Signed)
 It was very nice to see you today!   I will send prescription in for Prozac , Valium , and Seroquel .  I will also refer you to see a psychiatrist.  Please send me a message in a few weeks to let us  know how you are doing.  Return in about 2 weeks (around 07/08/2024).   Take care, Dr Kennyth  PLEASE NOTE:  If you had any lab tests, please let us  know if you have not heard back within a few days. You may see your results on mychart before we have a chance to review them but we will give you a call once they are reviewed by us .   If we ordered any referrals today, please let us  know if you have not heard from their office within the next week.   If you had any urgent prescriptions sent in today, please check with the pharmacy within an hour of our visit to make sure the prescription was transmitted appropriately.   Please try these tips to maintain a healthy lifestyle:  Eat at least 3 REAL meals and 1-2 snacks per day.  Aim for no more than 5 hours between eating.  If you eat breakfast, please do so within one hour of getting up.   Each meal should contain half fruits/vegetables, one quarter protein, and one quarter carbs (no bigger than a computer mouse)  Cut down on sweet beverages. This includes juice, soda, and sweet tea.   Drink at least 1 glass of water with each meal and aim for at least 8 glasses per day  Exercise at least 150 minutes every week.

## 2024-06-24 NOTE — Progress Notes (Signed)
 Matthew Roach is a 38 y.o. male who presents today for an office visit.  Assessment/Plan:  Chronic Problems Addressed Today: Anxiety Symptoms are not controlled.  Had lengthy discussion with patient today regarding his recent issues with anxiety and depression as well as his recent hospitalizations.  We did discuss need for him to establish with a psychiatrist.  Unfortunately he has had difficulty with getting this established since his most recent hospitalization about 4 months ago.  Will place urgent referral today and hopefully he will be able to see them soon.  Denies any SI or HI today and no other red flag signs or manic symptoms-do not think he needs to go to the hospital at this point.  Given his uncontrolled symptoms we will initiate pharmacologic management treatment today while we await his psychiatry referral.    He has done well with Prozac  in the past for managing his anxiety symptoms and would like to restart this today as a low-dose.  We did discuss several other potential alternatives including BuSpar  or hydroxyzine , gabapentin, etc. however patient stated that none of these were particularly effective in the past.  We did discuss potential for this to exacerbate mania symptoms however patient stated that he had been on this for a couple of years in the past without any significant issues.  Given his uncontrolled anxiety symptoms would be reasonable for us  to restart low-dose Prozac  today however we did strictly go over warning signs for development of manic symptoms.  He is currently with his brother who will monitor him closely as well.  Will also start Seroquel  50 mg nightly.  Hopefully this will help with his anxiety symptoms as well as insomnia or manic symptoms.   We will also switch his clonazepam  to Valium  as he does not feel like the clonazepam  is effective any longer and he did well with Valium  when he was on this a few months ago.  He will follow-up with us  in a  couple of weeks via MyChart though as above we did discuss reasons to recheck to care sooner including worsening mood, symptoms of mania, or other red flag signs or symptoms.     Subjective:  HPI:  Patient is here today with his brother today. He is here today transfer care to us  for primary care.  His primary concern today is uncontrolled anxiety.  He was last seen in this office by his previous PCP about 7 months ago.  At that time, his anxiety is well-controlled on Prozac  20 mg daily and clonazepam  1 mg twice daily.  About a month or so after this visit he went to urgent care with insomnia for 72 hours and was directed to go to the emergency room.  At this time also started developing hallucinations as well.  He was placed under IVC and admitted to [facility] for 3 days.  They discontinued his Prozac  and he was discharged home.  He was then seen by outpatient provider and added on Remeron and Zyprexa.  He then subsequently presented to the ED at [facility] about a month or so after this with another episode of reported auditory hallucinations and concern for manic symptoms.  He was having very poor sleep during this time as well.  He was admitted to the psychiatric hospital there for a couple of weeks.  He was continued on clonazepam  1 mg twice a day and started on Zyprexa 5 mg nightly.  Also started on lithium extended release form  250 mg twice daily.  His Klonopin  was changed to Valium  20 mg twice daily and Zyprexa was increased to 15 mg daily.  His symptoms improved and he was discharged home.  He had difficulty with establishing with a psychiatrist since being discharged.  He is no longer on Zyprexa or lithium.  He has been taking the clonazepam  1 mg twice daily however his anxiety symptoms are not controlled.  He believes they have been worsening last few months as well.     06/24/2024    2:00 PM 12/21/2023   11:50 AM 06/05/2023    9:05 AM 03/08/2022    1:36 PM  Depression screen PHQ  2/9  Decreased Interest 3 0 1 2  Down, Depressed, Hopeless 2 1 0 1  PHQ - 2 Score 5 1 1 3   Altered sleeping 3 0 1 1  Tired, decreased energy 3 1 1 2   Change in appetite 3 2 1 2   Feeling bad or failure about yourself  1 0 0 1  Trouble concentrating 2 0 0 1  Moving slowly or fidgety/restless 2 0 0 2  Suicidal thoughts 0 0 0 1  PHQ-9 Score 19 4 4 13   Difficult doing work/chores Extremely dIfficult Somewhat difficult Not difficult at all Extremely dIfficult      06/24/2024    2:01 PM 12/21/2023   11:54 AM 06/05/2023    9:05 AM  GAD 7 : Generalized Anxiety Score  Nervous, Anxious, on Edge 3 2 1   Control/stop worrying 2 1 0  Worry too much - different things 2 1 1   Trouble relaxing 2 1 1   Restless 2 0 1  Easily annoyed or irritable 2 1 1   Afraid - awful might happen 2 1 1   Total GAD 7 Score 15 7 6   Anxiety Difficulty Extremely difficult Somewhat difficult Somewhat difficult            Objective:  Physical Exam: BP 120/80   Pulse 63   Temp (!) 97.5 F (36.4 C) (Temporal)   Ht 6' 2 (1.88 m)   Wt 189 lb 6.4 oz (85.9 kg)   SpO2 96%   BMI 24.32 kg/m   Gen: No acute distress, resting comfortably Neuro: Grossly normal, moves all extremities Psych: Normal affect and thought content  Time Spent: 45 minutes of total time was spent on the date of the encounter performing the following actions: chart review prior to seeing the patient including recent visit with PCP and specialists, obtaining history, performing a medically necessary exam, counseling on the treatment plan, placing orders, and documenting in our EHR.        Worth HERO. Kennyth, MD 06/24/2024 2:09 PM

## 2024-06-24 NOTE — Assessment & Plan Note (Signed)
 Symptoms are not controlled.  Had lengthy discussion with patient today regarding his recent issues with anxiety and depression as well as his recent hospitalizations.  We did discuss need for him to establish with a psychiatrist.  Unfortunately he has had difficulty with getting this established since his most recent hospitalization about 4 months ago.  Will place urgent referral today and hopefully he will be able to see them soon.  Denies any SI or HI today and no other red flag signs or manic symptoms-do not think he needs to go to the hospital at this point.  Given his uncontrolled symptoms we will initiate pharmacologic management treatment today while we await his psychiatry referral.    He has done well with Prozac  in the past for managing his anxiety symptoms and would like to restart this today as a low-dose.  We did discuss several other potential alternatives including BuSpar  or hydroxyzine , gabapentin, etc. however patient stated that none of these were particularly effective in the past.  We did discuss potential for this to exacerbate mania symptoms however patient stated that he had been on this for a couple of years in the past without any significant issues.  Given his uncontrolled anxiety symptoms would be reasonable for us  to restart low-dose Prozac  today however we did strictly go over warning signs for development of manic symptoms.  He is currently with his brother who will monitor him closely as well.  Will also start Seroquel  50 mg nightly.  Hopefully this will help with his anxiety symptoms as well as insomnia or manic symptoms.   We will also switch his clonazepam  to Valium  as he does not feel like the clonazepam  is effective any longer and he did well with Valium  when he was on this a few months ago.  He will follow-up with us  in a couple of weeks via MyChart though as above we did discuss reasons to recheck to care sooner including worsening mood, symptoms of mania, or other  red flag signs or symptoms.

## 2024-07-10 ENCOUNTER — Ambulatory Visit (INDEPENDENT_AMBULATORY_CARE_PROVIDER_SITE_OTHER): Payer: MEDICAID | Admitting: Family Medicine

## 2024-07-10 ENCOUNTER — Encounter: Payer: Self-pay | Admitting: Family Medicine

## 2024-07-10 VITALS — BP 106/68 | HR 85 | Temp 97.2°F | Ht 74.0 in | Wt 195.2 lb

## 2024-07-10 DIAGNOSIS — F419 Anxiety disorder, unspecified: Secondary | ICD-10-CM

## 2024-07-10 NOTE — Assessment & Plan Note (Addendum)
 His symptoms are improving though still not fully controlled.  He has been taking his Prozac  20 mg daily and does feel like it has given some benefit though he is aware that it has not had enough time to get to maximal effectiveness at this time.  He is not experiencing any manic symptoms.  He has only been taking the Seroquel  intermittently as needed.  We did discuss taking this nightly to help augment the effects of his Prozac  and also help with his anxiety symptoms.  He is agreeable to do this.  He has been taking the Valium  which does work well.  No significant side effects.  We did refer him to psychiatry a couple of weeks ago however he has not yet heard from them.  Will check on status of his referral.  Will defer further management to psychiatrist however he will follow-up with us  in a few weeks and MyChart if he has not heard from them by then.

## 2024-07-10 NOTE — Patient Instructions (Signed)
 It was very nice to see you today!  VISIT SUMMARY: Today, you had a follow-up visit to discuss your anxiety and depression treatment. You are currently taking Prozac , Seroquel , and Valium , and you are experiencing some improvement in your symptoms.  YOUR PLAN: ANXIETY: You are experiencing anxiety and depressive symptoms, with some improvement since starting Prozac  two weeks ago. You are also taking Seroquel  for insomnia and Valium  for anxiety. -Continue taking Prozac  20 mg orally daily. -Take Seroquel  50 mg orally nightly,  -Continue taking Valium  20 mg orally twice daily. -Follow up with the psychiatrist you were referred to and try to expedite the appointment if possible. -Check in via message in 4-6 weeks to report your progress.  Return in about 6 months (around 01/10/2025) for Annual Physical.   Take care, Dr Kennyth  PLEASE NOTE:  If you had any lab tests, please let us  know if you have not heard back within a few days. You may see your results on mychart before we have a chance to review them but we will give you a call once they are reviewed by us .   If we ordered any referrals today, please let us  know if you have not heard from their office within the next week.   If you had any urgent prescriptions sent in today, please check with the pharmacy within an hour of our visit to make sure the prescription was transmitted appropriately.   Please try these tips to maintain a healthy lifestyle:  Eat at least 3 REAL meals and 1-2 snacks per day.  Aim for no more than 5 hours between eating.  If you eat breakfast, please do so within one hour of getting up.   Each meal should contain half fruits/vegetables, one quarter protein, and one quarter carbs (no bigger than a computer mouse)  Cut down on sweet beverages. This includes juice, soda, and sweet tea.   Drink at least 1 glass of water with each meal and aim for at least 8 glasses per day  Exercise at least 150 minutes every  week.

## 2024-07-10 NOTE — Progress Notes (Signed)
   Matthew Roach is a 38 y.o. male who presents today for an office visit.  Assessment/Plan:  Chronic Problems Addressed Today: Anxiety His symptoms are improving though still not fully controlled.  He has been taking his Prozac  20 mg daily and does feel like it has given some benefit though he is aware that it has not had enough time to get to maximal effectiveness at this time.  He is not experiencing any manic symptoms.  He has only been taking the Seroquel  intermittently as needed.  We did discuss taking this nightly to help augment the effects of his Prozac  and also help with his anxiety symptoms.  He is agreeable to do this.  He has been taking the Valium  which does work well.  No significant side effects.  We did refer him to psychiatry a couple of weeks ago however he has not yet heard from them.  Will check on status of his referral.  Will defer further management to psychiatrist however he will follow-up with us  in a few weeks and MyChart if he has not heard from them by then.     Subjective:  HPI:  See assessment / plan for status of chronic conditions.  Patient is here today for follow-up.  I last saw him for his initial visit with me about 2 weeks ago.  At that time we had extensive discussion regarding treatment for his anxiety.  We started him on Prozac  which she had done well with in the past.  We also started him on Seroquel  50 mg nightly and switch his clonazepam  to Valium .  We also had referred him to see a psychiatrist as well.  Discussed the use of AI scribe software for clinical note transcription with the patient, who gave verbal consent to proceed.  History of Present Illness Matthew Roach is a 38 year old male with anxiety who presents for a follow-up visit.  He was last seen two weeks ago when he was started on Prozac  and Seroquel , and switched from clonazepam  to Valium . He is waiting for the Prozac  to take full effect, which may take four to six weeks. He  experiences anxiety symptoms, including hair loss attributed to stress, and episodes of increased heart rate and cold sweats, which are manageable with medication.  He has been prescribed Seroquel  50 mg nightly, which he takes primarily when experiencing insomnia, approximately half the week. It helps him relax if he is already somewhat calm, but its effectiveness during high anxiety is uncertain.The switch from clonazepam  to Valium  has been beneficial, making his anxiety more manageable compared to previous experiences.  He has not yet heard back from the psychiatrist he was referred to, which is a concern for him. He expresses frustration with the difficulty in accessing psychiatric care.         Objective:  Physical Exam: BP 106/68   Pulse 85   Temp (!) 97.2 F (36.2 C) (Temporal)   Ht 6' 2 (1.88 m)   Wt 195 lb 3.2 oz (88.5 kg)   SpO2 96%   BMI 25.06 kg/m   Gen: No acute distress, resting comfortably Neuro: Grossly normal, moves all extremities Psych: Normal affect and thought content      Matthew Roach M. Kennyth, MD 07/10/2024 9:32 AM

## 2024-07-16 ENCOUNTER — Other Ambulatory Visit: Payer: Self-pay | Admitting: Family Medicine

## 2024-08-04 ENCOUNTER — Encounter (HOSPITAL_COMMUNITY): Payer: Self-pay | Admitting: Emergency Medicine

## 2024-08-04 ENCOUNTER — Other Ambulatory Visit: Payer: Self-pay

## 2024-08-04 ENCOUNTER — Emergency Department (HOSPITAL_COMMUNITY)
Admission: EM | Admit: 2024-08-04 | Discharge: 2024-08-05 | Disposition: A | Discharge status: Other Acute Inpatient Hospital | Payer: MEDICAID | Attending: Emergency Medicine | Admitting: Emergency Medicine

## 2024-08-04 ENCOUNTER — Emergency Department (HOSPITAL_COMMUNITY): Payer: MEDICAID

## 2024-08-04 DIAGNOSIS — F23 Brief psychotic disorder: Secondary | ICD-10-CM

## 2024-08-04 DIAGNOSIS — R0789 Other chest pain: Secondary | ICD-10-CM | POA: Insufficient documentation

## 2024-08-04 DIAGNOSIS — F411 Generalized anxiety disorder: Secondary | ICD-10-CM | POA: Diagnosis not present

## 2024-08-04 DIAGNOSIS — F22 Delusional disorders: Secondary | ICD-10-CM | POA: Diagnosis present

## 2024-08-04 LAB — CBC WITH DIFFERENTIAL/PLATELET
Abs Immature Granulocytes: 0.03 K/uL (ref 0.00–0.07)
Basophils Absolute: 0.1 K/uL (ref 0.0–0.1)
Basophils Relative: 1 %
Eosinophils Absolute: 0 K/uL (ref 0.0–0.5)
Eosinophils Relative: 0 %
HCT: 41 % (ref 39.0–52.0)
Hemoglobin: 12.7 g/dL — ABNORMAL LOW (ref 13.0–17.0)
Immature Granulocytes: 0 %
Lymphocytes Relative: 22 %
Lymphs Abs: 2.1 K/uL (ref 0.7–4.0)
MCH: 25.5 pg — ABNORMAL LOW (ref 26.0–34.0)
MCHC: 31 g/dL (ref 30.0–36.0)
MCV: 82.2 fL (ref 80.0–100.0)
Monocytes Absolute: 0.5 K/uL (ref 0.1–1.0)
Monocytes Relative: 5 %
Neutro Abs: 6.8 K/uL (ref 1.7–7.7)
Neutrophils Relative %: 72 %
Platelets: 298 K/uL (ref 150–400)
RBC: 4.99 MIL/uL (ref 4.22–5.81)
RDW: 15 % (ref 11.5–15.5)
WBC: 9.5 K/uL (ref 4.0–10.5)
nRBC: 0 % (ref 0.0–0.2)

## 2024-08-04 LAB — TROPONIN T, HIGH SENSITIVITY
Troponin T High Sensitivity: <15 ng/L (ref 0–19)
Troponin T High Sensitivity: <15 ng/L (ref 0–19)

## 2024-08-04 LAB — COMPREHENSIVE METABOLIC PANEL WITH GFR
ALT: 17 U/L (ref 0–44)
AST: 24 U/L (ref 15–41)
Albumin: 4.8 g/dL (ref 3.5–5.0)
Alkaline Phosphatase: 68 U/L (ref 38–126)
Anion gap: 16 — ABNORMAL HIGH (ref 5–15)
BUN: 20 mg/dL (ref 6–20)
CO2: 24 mmol/L (ref 22–32)
Calcium: 9.6 mg/dL (ref 8.9–10.3)
Chloride: 102 mmol/L (ref 98–111)
Creatinine, Ser: 0.82 mg/dL (ref 0.61–1.24)
GFR, Estimated: >60 mL/min (ref >60)
Glucose, Bld: 105 mg/dL — ABNORMAL HIGH (ref 70–99)
Potassium: 4.1 mmol/L (ref 3.5–5.1)
Sodium: 142 mmol/L (ref 135–145)
Total Bilirubin: 0.3 mg/dL (ref 0.0–1.2)
Total Protein: 7.6 g/dL (ref 6.5–8.1)

## 2024-08-04 LAB — ETHANOL: Alcohol, Ethyl (B): <15 mg/dL (ref <15)

## 2024-08-04 LAB — ACETAMINOPHEN LEVEL: Acetaminophen (Tylenol), Serum: <10 ug/mL — ABNORMAL LOW (ref 10–30)

## 2024-08-04 LAB — SALICYLATE LEVEL: Salicylate Lvl: <7 mg/dL — ABNORMAL LOW (ref 7.0–30.0)

## 2024-08-04 MED ORDER — FLUOXETINE HCL 20 MG PO CAPS
20.0000 mg | ORAL_CAPSULE | Freq: Every day | ORAL | Status: DC
  Filled 2024-08-04: qty 1

## 2024-08-04 MED ORDER — METHADONE HCL 10 MG/ML PO CONC
110.0000 mg | Freq: Every day | ORAL | Status: DC
  Filled 2024-08-04 (×2): qty 15

## 2024-08-04 MED ORDER — ACETAMINOPHEN 500 MG PO TABS
1000.0000 mg | ORAL_TABLET | Freq: Once | ORAL | Status: DC
  Filled 2024-08-04: qty 2

## 2024-08-04 MED ORDER — DIAZEPAM 5 MG PO TABS
20.0000 mg | ORAL_TABLET | Freq: Two times a day (BID) | ORAL | Status: DC | PRN
  Administered 2024-08-05: 20 mg via ORAL
  Filled 2024-08-04 (×2): qty 4

## 2024-08-04 MED ORDER — QUETIAPINE FUMARATE 50 MG PO TABS
50.0000 mg | ORAL_TABLET | Freq: Every day | ORAL | Status: DC
  Administered 2024-08-04: 50 mg via ORAL
  Filled 2024-08-04: qty 1

## 2024-08-04 MED ORDER — ZIPRASIDONE MESYLATE 20 MG IM SOLR: 20.0000 mg | Freq: Once | INTRAMUSCULAR | Status: DC

## 2024-08-04 NOTE — ED Notes (Addendum)
 Pt wanded by security and belongings placed in secure locker #[id number]

## 2024-08-04 NOTE — ED Triage Notes (Signed)
 Pt bib EMS from home. Pt called EMS for chest pain but upon evaluation by EMS patient unable to answer questions appropriately. Brother and mother stated pt has been in a slightly catatonic state just staring off for a few days. Pt has history of drug abuse. Pt has been seen for psych issues but brother and mother not sure of actual diagnosis. Pt is stating he is worried something is wrong with his aorta and his chest pain in right over his sternum.  Methadone  use Cocaine use unsure when last used Brother stated pt was on psych medications but not sure what it was and it was helping but new doctor took him off and he has seemed to decline since.  EMS stated to call brother Matthew Roach for information that he is more reliable and pt responds better to older brother.  140/80 90 98 125 CBG

## 2024-08-04 NOTE — ED Provider Notes (Signed)
 [place] EMERGENCY DEPARTMENT AT [facility] Provider Note  CSN: [id number] Arrival date & time: 08/04/24 9185  Chief Complaint(s) Psychiatric Evaluation  HPI Matthew Roach is a 38 y.o. male history of anxiety, polysubstance abuse, psychosis presenting to the emergency department with chest pain.  Patient reports that he is here because his chest hurts.  It does not currently hurt.  He reports that it hurt for a brief period this morning.  He was worried that it was my aorta.  He denies any history of problems with his aorta.  Denies any radiation of his pain.  Denies any shortness of breath.  Denies any lightheadedness or dizziness, fevers or chills.  Denies any difficulty breathing.  Denies any headaches back pain.  Denies any abdominal pain.  Currently reports he feels well denies pain.  EMS also report that the patient has been confused.  He reports that he has had some memory problems family reports that he has been just staring off for a few days.  He denies any SI, HI, hallucinations.   Past Medical History Past Medical History:  Diagnosis Date   Anxiety    Depression    Seizures (HCC)    Patient Active Problem List   Diagnosis Date Noted   Acute psychosis (HCC) 01/24/2024   Anxiety 04/13/2022   Home Medication(s) Prior to Admission medications   Medication Sig Start Date End Date Taking? Authorizing Provider  diazepam  (VALIUM ) 10 MG tablet Take 2 tablets (20 mg total) by mouth every 12 (twelve) hours as needed for anxiety. 06/24/24  Yes Kennyth Worth HERO, MD  FLUoxetine  (PROZAC ) 20 MG capsule Take 1 capsule (20 mg total) by mouth daily. 06/24/24  Yes Kennyth Worth HERO, MD  methadone  (DOLOPHINE ) 10 MG/ML solution Take 125 mg by mouth daily.   Yes [provider]  QUEtiapine  (SEROQUEL ) 50 MG tablet TAKE 1 TABLET BY MOUTH EVERYDAY AT BEDTIME 07/16/24  Yes Kennyth Worth HERO, MD  clonazePAM  (KLONOPIN ) 1 MG tablet Take 1 mg by mouth 2 (two) times daily. Patient  not taking: Reported on 08/04/2024    [provider]  lithium carbonate (ESKALITH) 450 MG ER tablet Take 450 mg by mouth 2 (two) times daily. Patient not taking: Reported on 08/04/2024    [provider]  mirtazapine (REMERON) 15 MG tablet Take 15 mg by mouth at bedtime. Patient not taking: Reported on 08/04/2024    [provider]  OLANZapine  (ZYPREXA ) 2.5 MG tablet Take 2.5 mg by mouth at bedtime. Patient not taking: Reported on 08/04/2024    [provider]  traZODone  (DESYREL ) 50 MG tablet Take 50 mg by mouth at bedtime. Patient not taking: Reported on 08/04/2024    [provider]                                                                                                                                    Past Surgical  History History reviewed. No pertinent surgical history. Family History Family History  Problem Relation Age of Onset   Diabetes Mother    Hypertension Father    Hyperlipidemia Father    Heart disease Father    Diabetes Father     Social History Social History   Tobacco Use   Smoking status: Never   Smokeless tobacco: Never  Vaping Use   Vaping status: Former   Substances: Nicotine   Substance Use Topics   Alcohol use: No   Drug use: Not Currently    Types: Marijuana    Comment: in past   Allergies Pork allergy  Review of Systems Review of Systems  All other systems reviewed and are negative.   Physical Exam Vital Signs  I have reviewed the triage vital signs BP (!) 142/85 (BP Location: Right Arm)   Pulse 96   Temp 98.3 F (36.8 C)   Resp 16   Ht 6' 2 (1.88 m)   SpO2 98%   BMI 25.06 kg/m  Physical Exam Vitals and nursing note reviewed.  Constitutional:      General: He is not in acute distress.    Appearance: Normal appearance.  HENT:     Mouth/Throat:     Mouth: Mucous membranes are moist.  Eyes:     Conjunctiva/sclera: Conjunctivae normal.  Cardiovascular:     Rate and Rhythm: Normal  rate and regular rhythm.     Pulses:          Radial pulses are 2+ on the right side and 2+ on the left side.  Pulmonary:     Effort: Pulmonary effort is normal. No respiratory distress.     Breath sounds: Normal breath sounds.  Abdominal:     General: Abdomen is flat.     Palpations: Abdomen is soft.     Tenderness: There is no abdominal tenderness.  Musculoskeletal:     Right lower leg: No edema.     Left lower leg: No edema.  Skin:    General: Skin is warm and dry.     Capillary Refill: Capillary refill takes less than 2 seconds.  Neurological:     Mental Status: He is alert and oriented to person, place, and time. Mental status is at baseline.  Psychiatric:        Attention and Perception: He is inattentive.        Mood and Affect: Mood normal. Affect is blunt.        Speech: Speech is delayed.        Behavior: Behavior is slowed and withdrawn.        Thought Content: Thought content is not paranoid. Thought content does not include homicidal ideation.     ED Results and Treatments Labs (all labs ordered are listed, but only abnormal results are displayed) Labs Reviewed  COMPREHENSIVE METABOLIC PANEL WITH GFR - Abnormal; Notable for the following components:      Result Value   Glucose, Bld 105 (*)    Anion gap 16 (*)    All other components within normal limits  CBC WITH DIFFERENTIAL/PLATELET - Abnormal; Notable for the following components:   Hemoglobin 12.7 (*)    MCH 25.5 (*)    All other components within normal limits  ACETAMINOPHEN  LEVEL - Abnormal; Notable for the following components:   Acetaminophen  (Tylenol ), Serum <10 (*)    All other components within normal limits  SALICYLATE LEVEL - Abnormal; Notable for the following components:   Salicylate Lvl <7.0 (*)  All other components within normal limits  ETHANOL  URINE DRUG SCREEN  TROPONIN T, HIGH SENSITIVITY  TROPONIN T, HIGH SENSITIVITY                                                                                                                           Radiology No results found.  Pertinent labs & imaging results that were available during my care of the patient were reviewed by me and considered in my medical decision making (see MDM for details).  Medications Ordered in ED Medications  diazepam  (VALIUM ) tablet 20 mg (20 mg Oral Given 08/05/24 1137)  FLUoxetine  (PROZAC ) capsule 20 mg (20 mg Oral Patient Refused/Not Given 08/05/24 1108)  methadone  (DOLOPHINE ) 10 MG/ML solution 110 mg (110 mg Oral Not Given 08/05/24 1114)  QUEtiapine  (SEROQUEL ) tablet 50 mg (50 mg Oral Given 08/04/24 2110)  acetaminophen  (TYLENOL ) tablet 1,000 mg (1,000 mg Oral Patient Refused/Not Given 08/04/24 1443)  ziprasidone  (GEODON ) injection 20 mg (0 mg Intramuscular Hold 08/04/24 1531)                                                                                                                                     Procedures Procedures  (including critical care time)  Medical Decision Making / ED Course   MDM:  38 year old presenting to the emergency department with chest pain, behavior change.  Examination overall reassuring.  Equal pulses, no murmur.  Patient in no distress.  Denies any ongoing pain.  Does seem slow and withdrawn, slightly inattentive, possibly reacting to internal stimuli.  Low concern for dangerous cause of chest pain, will obtain EKG, troponin, chest x-ray.  He was worried about aorta problem but overall seems very unlikely given reassuring history, equal pulses.  No history of any issues with his aorta.  If chest x-ray is reassuring do not think he needs further workup for this.  Does seem to have behavior change, concerning for possible psychosis or catatonia.  Will need psychiatric evaluation following medical clearance.  Denies any SI or HI.  Clinical Course as of 08/05/24 1256  Sun Aug 04, 2024  1500 Workup negative for acute process. Collateral provided  by brother  concerning for severe psychosis. Placed on IVC as pt wanted to leave. Medically cleared for psych eval  [WS]    Clinical Course User Index [WS] Francesca Elsie CROME, MD     Additional  history obtained: -Additional history obtained from family -External records from outside source obtained and reviewed including: Chart review including previous notes, labs, imaging, consultation notes including prior notes   Lab Tests: -I ordered, reviewed, and interpreted labs.   The pertinent results include:   Labs Reviewed  COMPREHENSIVE METABOLIC PANEL WITH GFR - Abnormal; Notable for the following components:      Result Value   Glucose, Bld 105 (*)    Anion gap 16 (*)    All other components within normal limits  CBC WITH DIFFERENTIAL/PLATELET - Abnormal; Notable for the following components:   Hemoglobin 12.7 (*)    MCH 25.5 (*)    All other components within normal limits  ACETAMINOPHEN  LEVEL - Abnormal; Notable for the following components:   Acetaminophen  (Tylenol ), Serum <10 (*)    All other components within normal limits  SALICYLATE LEVEL - Abnormal; Notable for the following components:   Salicylate Lvl <7.0 (*)    All other components within normal limits  ETHANOL  URINE DRUG SCREEN  TROPONIN T, HIGH SENSITIVITY  TROPONIN T, HIGH SENSITIVITY    Notable for mild anemia   EKG   EKG Interpretation Date/Time:  Sunday August 04 2024 08:39:06 EDT Ventricular Rate:  93 PR Interval:  161 QRS Duration:  89 QT Interval:  330 QTC Calculation: 411 R Axis:   -2  Text Interpretation: Sinus rhythm Confirmed by Francesca Fallow ([id number]) on 08/04/2024 9:25:14 AM         Imaging Studies ordered: I ordered imaging studies including CXR On my interpretation imaging demonstrates no acute process I independently visualized and interpreted imaging. I agree with the radiologist interpretation   Medicines ordered and prescription drug management: Meds ordered this encounter   Medications   diazepam  (VALIUM ) tablet 20 mg   FLUoxetine  (PROZAC ) capsule 20 mg   methadone  (DOLOPHINE ) 10 MG/ML solution 110 mg    Refill:  0   QUEtiapine  (SEROQUEL ) tablet 50 mg   acetaminophen  (TYLENOL ) tablet 1,000 mg   ziprasidone  (GEODON ) injection 20 mg    -I have reviewed the patients home medicines and have made adjustments as needed  Social Determinants of Health:  Diagnosis or treatment significantly limited by social determinants of health: polysubstance abuse   Reevaluation: After the interventions noted above, I reevaluated the patient and found that their symptoms have stayed the same  Co morbidities that complicate the patient evaluation  Past Medical History:  Diagnosis Date   Anxiety    Depression    Seizures (HCC)       Dispostion: Disposition decision including need for hospitalization was considered, and patient boarding for psych eval    Final Clinical Impression(s) / ED Diagnoses Final diagnoses:  Acute psychosis (HCC)     This chart was dictated using voice recognition software.  Despite best efforts to proofread,  errors can occur which can change the documentation meaning.    Francesca Fallow CROME, MD 08/05/24 1256

## 2024-08-04 NOTE — Progress Notes (Signed)
 Patient has been denied by [facility] due to no appropriate beds available. Patient meets [facility] inpatient criteria per Cathaleen Adam, PMHNP. Patient has been faxed out to the following facilities:   [facility]  [address] [place]., [place] [address] [phone]  [facility]  [address] [place] [address] [phone]  [facility]  [address] [place] [address] [phone]  [facility]  [address] [place], [place] [address] [phone]  [facility] Patient Placement  [facility] [place] [phone]  [facility]  [address] [place] [address] [phone]  [facility]  [address] [place] [address] [phone]  [facility] EFAX  [address] [place], [place] [phone]  [facility]  [address] [place] [address] [phone]  [facility]  [address] [place] [address] [phone]  [facility]  [address] [place], [place] [address] [phone]  [facility]  [address] [place] [address] [phone]  [facility]  [address], [place] [address] [phone]  [facility]  [address] [place] [address] [phone]  [facility]  [address], [place] [address] [phone]  [facility]  [address], [place] [address] [phone]   Bunnie Gallop, MSW, LCSW-A  4:34 PM 08/04/2024

## 2024-08-04 NOTE — Consult Note (Signed)
 [facility] Psychiatric Consult Initial  Patient Name: .Matthew Roach  MRN: [id number]  DOB: 09/28/1986  Consult Order details:  Orders (From admission, onward)     Start     Ordered   08/04/24 1250  CONSULT TO CALL ACT TEAM       Ordering Provider: Francesca Elsie CROME, MD  Provider:  (Not yet assigned)  Question:  Reason for Consult?  Answer:  Psych consult   08/04/24 1250             Mode of Visit: In person    Psychiatry Consult Evaluation  Service Date: August 04, 2024 LOS:  LOS: 0 days  Chief Complaint Chest pain and altered mental status  Primary Psychiatric Diagnoses  Acute psychosis 2.   Generalized anxiety disorder  Assessment  Matthew Roach is a 38 y.o. male admitted: Presented to the EDfor 08/04/2024  8:20 AM for chest pain and altered mental status. He carries the psychiatric diagnoses of psychosis and has a past medical history of insomnia, and seizure disorder.   This 38 year old male presents with acute psychosis and altered mental status. His bizarre behavior, thought blocking, paranoia, and significant functional decline raise concern for a severe psychiatric condition, likely exacerbated by recent medication changes and potential substance use. Family reports a history of positive response to prior inpatient stabilization, indicating a likelihood of benefit from structured psychiatric care.  Given his current inability to care for himself, risk of harm due to untreated psychosis, and lack of clarity regarding medication compliance and substance use, inpatient psychiatric admission is recommended for diagnostic clarification, medication stabilization, and safety monitoring. Please see plan below for detailed recommendations.   Diagnoses:  Active Hospital problems: Principal Problem:   Acute psychosis (HCC)    Plan   ## Psychiatric Medication Recommendations:  Recommend home medications  ## Medical Decision Making Capacity: Not specifically  addressed in this encounter  ## Further Work-up:  -- No further workup needed at this time EKG or UDS -- most recent EKG on 08/04/2024 had QtC of 411 -- Pertinent labwork reviewed earlier this admission includes: UDS, CMP, CBC, EKG   ## Disposition:-- We recommend inpatient psychiatric hospitalization after medical hospitalization. Patient has been involuntarily committed on 08/04/2024.   ## Behavioral / Environmental: -To minimize splitting of staff, assign one staff person to communicate all information from the team when feasible. or Utilize compassion and acknowledge the patient's experiences while setting clear and realistic expectations for care.    ## Safety and Observation Level:  - Based on my clinical evaluation, I estimate the patient to be at low risk of self harm in the current setting. - At this time, we recommend  routine. This decision is based on my review of the chart including patient's history and current presentation, interview of the patient, mental status examination, and consideration of suicide risk including evaluating suicidal ideation, plan, intent, suicidal or self-harm behaviors, risk factors, and protective factors. This judgment is based on our ability to directly address suicide risk, implement suicide prevention strategies, and develop a safety plan while the patient is in the clinical setting. Please contact our team if there is a concern that risk level has changed.  CSSR Risk Category:   Suicide Risk Assessment: Patient has following modifiable risk factors for suicide: under treated depression , medication noncompliance, lack of access to outpatient mental health resources, and recent psychiatric hospitalization, which we are addressing by recommending inpatient psychiatric admission. Patient has following non-modifiable or demographic risk factors for  suicide: male gender and psychiatric hospitalization Patient has the following protective factors against  suicide: Supportive family and Supportive friends  Thank you for this consult request. Recommendations have been communicated to the primary team.  We will continue to follow patient at this time.   CATHALEEN ADAM, PMHNP       History of Present Illness  Relevant Aspects of Hospital ED Course:  Admitted on 08/04/2024 for Chest pain and altered mental status  Patient Report:  Patient was brought to the ED via EMS after calling for chest pain, described as being directly over his sternum. Upon EMS evaluation, he was unable to answer questions appropriately. Family reports that for the past several days, the patient has been in a catatonic-like state, mostly staring blankly and minimally interactive.  Patient has a history of drug abuse and prior psychiatric treatment, but his family is unclear of his exact diagnosis. His brother reported that the patient had previously done well while on psychiatric medications, but after a recent provider change and medication adjustments, he began to decline in functioning.  The patient currently reports being poisoned by multiple sources but is unable to specify by whom or through what means. He endorses depressed mood for several months related to past trauma. He denies recent drug use initially, then later stated he uses opiates and benzodiazepines, claiming they are prescribed by his psychiatrist.  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  Cocaine use - last use unknown.  Past positive UDS for marijuana and benzodiazepines.  UDS is pending today.  Patient reports his only current medication is Prozac , last taken this morning. He states he eats three meals per day, although his appetite has decreased, and reports sleeping 8-10 hours per night, which does not appear impaired.  Psych ROS:  Depression: Endorses Anxiety: Endorses Mania (lifetime and current): Denies Psychosis:  (lifetime and current): Denies  Collateral information:  Spoke with patient's brother, Matthew Roach, who provided the following information: Patient has had several prior episodes of hallucinations and paranoia. Past psychiatric admissions: [facility] - hospitalized for 3 days; symptoms worsened upon discharge. [facility] ([place], [place]) - hospitalized for 3 weeks with significant improvement while on medications. Post-discharge, the family attempted to establish care with [facility], but visits were primarily telehealth, which the family felt was inadequate. [facility] changed patient's medications without informing the family, leading to further decline. Over the last 3 days, the patient has been walking around the house like a zombie, with markedly reduced interaction. Over the past 2 days, patient began complaining of chest pain, which prompted him to call EMS today. Patient lives with both parents, is currently unemployed due to mental health concerns, and the family is actively seeking accurate diagnosis and stable psychiatric care.  Review of Systems  Psychiatric/Behavioral:  Positive for depression and substance abuse.      Psychiatric and Social History  Psychiatric History:  Information collected from patient and patient brother  Prev Dx/Sx: Psychosis Current Psych Provider: [facility] Med[facility] (current): Yes Previous Med Trials: Yes Therapy: Denies  Prior Psych Hospitalization: Yes Prior Self Harm: Denies Prior Violence: Denies  Family Psych History: Denies Family Hx suicide: Denies  Social History:  Developmental Hx: Deferred Educational Hx: Patient graduated high school Occupational Hx: Unemployed Legal Hx: Denies Living Situation: Lives at home with parents Spiritual Hx: Yes Access to weapons/lethal means: Denies   Substance History Alcohol: Yes Type of alcohol unknown Last Drink unknown Number of  drinks per day varies History of alcohol withdrawal seizures Denies History  of DT's Denies Tobacco: Denies Illicit drugs: Yes Prescription drug abuse: Denies Rehab hx: Denies  Exam Findings  Physical Exam:  Vital Signs:  Temp:  [98.4 F (36.9 C)] 98.4 F (36.9 C) (09/14 0824) Pulse Rate:  [97] 97 (09/14 0824) Resp:  [18] 18 (09/14 0824) BP: (135)/(89) 135/89 (09/14 0824) SpO2:  [97 %] 97 % (09/14 0824) Blood pressure 135/89, pulse 97, temperature 98.4 F (36.9 C), temperature source Oral, resp. rate 18, height 6' 2 (1.88 m), SpO2 97%. Body mass index is 25.06 kg/m.  Physical Exam Vitals and nursing note reviewed. Exam conducted with a chaperone present.  Neurological:     Mental Status: He is alert.  Psychiatric:        Attention and Perception: Attention normal.        Mood and Affect: Mood is depressed. Affect is flat.        Speech: Speech is delayed.        Behavior: Behavior is cooperative.        Thought Content: Thought content is delusional.        Cognition and Memory: Cognition is impaired.        Judgment: Judgment is inappropriate.     Comments: Patient appeared to be having some thought blocking, not sure if he is responding to internal stimuli     Mental Status Exam: General Appearance: Lying back in gurney, bizarre affect and behavior.  Orientation:  Unable to fully assess due to presentation.  Memory:  Immediate;   Fair  Concentration:  Concentration: Poor  Recall:  Fair  Attention  Poor  Eye Contact:  Fair  Speech:  Tangential at times, thought blocking noted, requires frequent redirection.  Language:  Fair  Volume:  Normal  Mood: Depressed.  Affect:  Blunt  Thought Process:  Disorganized; repeats questions asked of him.  Thought Content:  Reports being poisoned by multiple sources; cannot elaborate.  Suicidal Thoughts:  No  Homicidal Thoughts:  No  Judgement:  Poor  Insight:  Lacking  Psychomotor Activity:  Normal  Akathisia:  No  Fund of Knowledge:  Fair      Assets:  Manufacturing systems engineer Desire for  Improvement Financial Resources/Insurance Housing Social Support  Cognition:  Impaired,  Mild  ADL's:  Intact  AIMS (if indicated):        Other History   These have been pulled in through the EMR, reviewed, and updated if appropriate.  Family History:  The patient's family history includes Diabetes in his father and mother; Heart disease in his father; Hyperlipidemia in his father; Hypertension in his father.  Medical History: Past Medical History:  Diagnosis Date   Anxiety    Depression    Seizures (HCC)     Surgical History: History reviewed. No pertinent surgical history.   Medications:   Current Facility-Administered Medications:    acetaminophen  (TYLENOL ) tablet 1,000 mg, 1,000 mg, Oral, Once, Scheving, Elsie CROME, MD   diazepam  (VALIUM ) tablet 20 mg, 20 mg, Oral, Q12H PRN, Francesca Elsie CROME, MD   FLUoxetine  (PROZAC ) capsule 20 mg, 20 mg, Oral, Daily, Scheving, Elsie CROME, MD   methadone  (DOLOPHINE ) 10 MG/ML solution 110 mg, 110 mg, Oral, Daily, Scheving, Elsie CROME, MD   QUEtiapine  (SEROQUEL ) tablet 50 mg, 50 mg, Oral, QHS, Scheving, Elsie CROME, MD   ziprasidone  (GEODON ) injection 20 mg, 20 mg, Intramuscular, Once, Francesca Elsie CROME, MD  Current Outpatient Medications:  diazepam  (VALIUM ) 10 MG tablet, Take 2 tablets (20 mg total) by mouth every 12 (twelve) hours as needed for anxiety., Disp: 120 tablet, Rfl: 5   FLUoxetine  (PROZAC ) 20 MG capsule, Take 1 capsule (20 mg total) by mouth daily., Disp: 90 capsule, Rfl: 1   methadone  (DOLOPHINE ) 10 MG/ML solution, Take 125 mg by mouth daily., Disp: , Rfl:    QUEtiapine  (SEROQUEL ) 50 MG tablet, TAKE 1 TABLET BY MOUTH EVERYDAY AT BEDTIME, Disp: 90 tablet, Rfl: 1   clonazePAM  (KLONOPIN ) 1 MG tablet, Take 1 mg by mouth 2 (two) times daily. (Patient not taking: Reported on 08/04/2024), Disp: , Rfl:    lithium carbonate (ESKALITH) 450 MG ER tablet, Take 450 mg by mouth 2 (two) times daily. (Patient not taking: Reported on  08/04/2024), Disp: , Rfl:    mirtazapine (REMERON) 15 MG tablet, Take 15 mg by mouth at bedtime. (Patient not taking: Reported on 08/04/2024), Disp: , Rfl:    OLANZapine (ZYPREXA) 2.5 MG tablet, Take 2.5 mg by mouth at bedtime. (Patient not taking: Reported on 08/04/2024), Disp: , Rfl:    traZODone (DESYREL) 50 MG tablet, Take 50 mg by mouth at bedtime. (Patient not taking: Reported on 08/04/2024), Disp: , Rfl:   Allergies: Allergies  Allergen Reactions   Pork Allergy Other (See Comments)    Religious/cultural restriction    Ionia Schey MOTLEY-MANGRUM, PMHNP

## 2024-08-04 NOTE — ED Notes (Signed)
 Pt dressed out. Security asked to wand

## 2024-08-04 NOTE — ED Notes (Signed)
 He said he is spitting out meds because he was poisoned at a different facility. Refusing purple scrubs

## 2024-08-05 ENCOUNTER — Inpatient Hospital Stay (HOSPITAL_COMMUNITY)
Admission: AD | Admit: 2024-08-05 | Discharge: 2024-08-20 | DRG: 885 | Disposition: A | Discharge status: Home / Self Care | Payer: MEDICAID | Source: Intra-hospital

## 2024-08-05 ENCOUNTER — Other Ambulatory Visit: Payer: Self-pay

## 2024-08-05 ENCOUNTER — Encounter (HOSPITAL_COMMUNITY): Payer: Self-pay | Admitting: Psychiatry

## 2024-08-05 DIAGNOSIS — R0789 Other chest pain: Secondary | ICD-10-CM | POA: Diagnosis not present

## 2024-08-05 DIAGNOSIS — F1394 Sedative, hypnotic or anxiolytic use, unspecified with sedative, hypnotic or anxiolytic-induced mood disorder: Principal | ICD-10-CM | POA: Insufficient documentation

## 2024-08-05 DIAGNOSIS — Z818 Family history of other mental and behavioral disorders: Secondary | ICD-10-CM

## 2024-08-05 DIAGNOSIS — F1121 Opioid dependence, in remission: Secondary | ICD-10-CM | POA: Diagnosis present

## 2024-08-05 DIAGNOSIS — Z833 Family history of diabetes mellitus: Secondary | ICD-10-CM

## 2024-08-05 DIAGNOSIS — K5900 Constipation, unspecified: Secondary | ICD-10-CM | POA: Diagnosis present

## 2024-08-05 DIAGNOSIS — Z79899 Other long term (current) drug therapy: Secondary | ICD-10-CM | POA: Diagnosis not present

## 2024-08-05 DIAGNOSIS — Z8249 Family history of ischemic heart disease and other diseases of the circulatory system: Secondary | ICD-10-CM | POA: Diagnosis not present

## 2024-08-05 DIAGNOSIS — Z87891 Personal history of nicotine dependence: Secondary | ICD-10-CM

## 2024-08-05 DIAGNOSIS — Z83438 Family history of other disorder of lipoprotein metabolism and other lipidemia: Secondary | ICD-10-CM | POA: Diagnosis not present

## 2024-08-05 DIAGNOSIS — F319 Bipolar disorder, unspecified: Principal | ICD-10-CM | POA: Diagnosis present

## 2024-08-05 DIAGNOSIS — F1320 Sedative, hypnotic or anxiolytic dependence, uncomplicated: Principal | ICD-10-CM | POA: Diagnosis present

## 2024-08-05 DIAGNOSIS — G4700 Insomnia, unspecified: Secondary | ICD-10-CM | POA: Diagnosis present

## 2024-08-05 DIAGNOSIS — R410 Disorientation, unspecified: Secondary | ICD-10-CM | POA: Insufficient documentation

## 2024-08-05 DIAGNOSIS — T50905A Adverse effect of unspecified drugs, medicaments and biological substances, initial encounter: Secondary | ICD-10-CM | POA: Insufficient documentation

## 2024-08-05 DIAGNOSIS — G8929 Other chronic pain: Secondary | ICD-10-CM | POA: Diagnosis present

## 2024-08-05 DIAGNOSIS — F19921 Other psychoactive substance use, unspecified with intoxication with delirium: Secondary | ICD-10-CM | POA: Diagnosis not present

## 2024-08-05 LAB — URINE DRUG SCREEN
Amphetamines: NEGATIVE
Barbiturates: NEGATIVE
Benzodiazepines: POSITIVE — AB
Cocaine: NEGATIVE
Fentanyl: NEGATIVE
Methadone Scn, Ur: POSITIVE — AB
Opiates: NEGATIVE
Tetrahydrocannabinol: POSITIVE — AB

## 2024-08-05 MED ORDER — ALUM & MAG HYDROXIDE-SIMETH 200-200-20 MG/5ML PO SUSP: 30.0000 mL | ORAL | Status: DC | PRN

## 2024-08-05 MED ORDER — DIPHENHYDRAMINE HCL 50 MG/ML IJ SOLN: 50.0000 mg | Freq: Three times a day (TID) | INTRAMUSCULAR | Status: DC | PRN

## 2024-08-05 MED ORDER — LORAZEPAM 1 MG PO TABS: 1.0000 mg | ORAL_TABLET | Freq: Every day | ORAL | Status: DC

## 2024-08-05 MED ORDER — LORAZEPAM 2 MG/ML IJ SOLN: 2.0000 mg | Freq: Three times a day (TID) | INTRAMUSCULAR | Status: DC | PRN

## 2024-08-05 MED ORDER — HALOPERIDOL 5 MG PO TABS: 5.0000 mg | ORAL_TABLET | Freq: Three times a day (TID) | ORAL | Status: DC | PRN

## 2024-08-05 MED ORDER — THIAMINE HCL 100 MG/ML IJ SOLN
100.0000 mg | Freq: Once | INTRAMUSCULAR | Status: DC
  Filled 2024-08-05: qty 2

## 2024-08-05 MED ORDER — ONDANSETRON 4 MG PO TBDP: 4.0000 mg | ORAL_TABLET | Freq: Four times a day (QID) | ORAL | Status: AC | PRN

## 2024-08-05 MED ORDER — HYDROXYZINE HCL 25 MG PO TABS: 25.0000 mg | ORAL_TABLET | Freq: Four times a day (QID) | ORAL | Status: AC | PRN

## 2024-08-05 MED ORDER — LORAZEPAM 1 MG PO TABS
1.0000 mg | ORAL_TABLET | Freq: Four times a day (QID) | ORAL | Status: DC
  Filled 2024-08-05 (×2): qty 1

## 2024-08-05 MED ORDER — HYDROXYZINE HCL 25 MG PO TABS
25.0000 mg | ORAL_TABLET | Freq: Three times a day (TID) | ORAL | Status: DC | PRN
  Administered 2024-08-05 – 2024-08-19 (×17): 25 mg via ORAL
  Filled 2024-08-05 (×18): qty 1

## 2024-08-05 MED ORDER — VITAMIN B-1 100 MG PO TABS
100.0000 mg | ORAL_TABLET | Freq: Every day | ORAL | Status: DC
  Administered 2024-08-08 – 2024-08-20 (×8): 100 mg via ORAL
  Filled 2024-08-05 (×14): qty 1

## 2024-08-05 MED ORDER — ADULT MULTIVITAMIN W/MINERALS CH
1.0000 | ORAL_TABLET | Freq: Every day | ORAL | Status: DC
  Administered 2024-08-08 – 2024-08-20 (×9): 1 via ORAL
  Filled 2024-08-05 (×16): qty 1

## 2024-08-05 MED ORDER — LORAZEPAM 1 MG PO TABS: 1.0000 mg | ORAL_TABLET | Freq: Two times a day (BID) | ORAL | Status: DC

## 2024-08-05 MED ORDER — ACETAMINOPHEN 325 MG PO TABS: 650.0000 mg | ORAL_TABLET | Freq: Four times a day (QID) | ORAL | Status: DC | PRN

## 2024-08-05 MED ORDER — DIPHENHYDRAMINE HCL 25 MG PO CAPS: 50.0000 mg | ORAL_CAPSULE | Freq: Three times a day (TID) | ORAL | Status: DC | PRN

## 2024-08-05 MED ORDER — LOPERAMIDE HCL 2 MG PO CAPS: 2.0000 mg | ORAL_CAPSULE | ORAL | Status: AC | PRN

## 2024-08-05 MED ORDER — LORAZEPAM 1 MG PO TABS
1.0000 mg | ORAL_TABLET | Freq: Three times a day (TID) | ORAL | Status: DC
Start: 2024-08-07 — End: 2024-08-08

## 2024-08-05 MED ORDER — HALOPERIDOL LACTATE 5 MG/ML IJ SOLN: 5.0000 mg | Freq: Three times a day (TID) | INTRAMUSCULAR | Status: DC | PRN

## 2024-08-05 MED ORDER — HALOPERIDOL LACTATE 5 MG/ML IJ SOLN: 10.0000 mg | Freq: Three times a day (TID) | INTRAMUSCULAR | Status: DC | PRN

## 2024-08-05 MED ORDER — LORAZEPAM 1 MG PO TABS: 1.0000 mg | ORAL_TABLET | Freq: Four times a day (QID) | ORAL | Status: AC | PRN

## 2024-08-05 MED ORDER — MAGNESIUM HYDROXIDE 400 MG/5ML PO SUSP
30.0000 mL | Freq: Every day | ORAL | Status: DC | PRN
  Administered 2024-08-17: 30 mL via ORAL
  Filled 2024-08-05: qty 30

## 2024-08-05 MED ORDER — METHADONE HCL 10 MG/ML PO CONC: 110.0000 mg | Freq: Every day | ORAL | Status: DC

## 2024-08-05 MED ORDER — TRAZODONE HCL 100 MG PO TABS
100.0000 mg | ORAL_TABLET | Freq: Every evening | ORAL | Status: DC | PRN
  Administered 2024-08-08 – 2024-08-19 (×9): 100 mg via ORAL
  Filled 2024-08-05 (×10): qty 1

## 2024-08-05 MED ORDER — FLUOXETINE HCL 20 MG PO CAPS
20.0000 mg | ORAL_CAPSULE | Freq: Every day | ORAL | Status: DC
  Filled 2024-08-05: qty 1

## 2024-08-05 MED ORDER — QUETIAPINE FUMARATE 50 MG PO TABS
50.0000 mg | ORAL_TABLET | Freq: Every day | ORAL | Status: DC
  Administered 2024-08-05: 50 mg via ORAL
  Filled 2024-08-05 (×2): qty 1

## 2024-08-05 NOTE — Plan of Care (Signed)
  Problem: Education: Goal: Knowledge of [facility] Education information/materials will improve Outcome: Not Progressing Goal: Emotional status will improve Outcome: Not Progressing Goal: Mental status will improve Outcome: Not Progressing   

## 2024-08-05 NOTE — Progress Notes (Signed)
 Pt has been accepted to [facility] on 08/05/2024 Bed assignment: 405-02  Pt meets inpatient criteria per: Cathaleen Jacobson NP  Attending Physician will be: Dr. Raliegh MD  Report can be called to: Adult unit: [phone]  Pt can arrive after pending UDS/ [facility] will update   Care Team Notified: [facility] Cherylynn Ernst RN, Cathaleen Jacobson NP, Chesley Holt [facility], Dylan Bratu paramedic   [place] Golden Gilreath LCSW-A   08/05/2024 11:08 AM

## 2024-08-05 NOTE — Progress Notes (Signed)
 [name], RN, 08/05/24, Time of arrival: 1430  Patient is a new admit to unit. Patient is involuntary. Patient belongings addressed and stored. Skin check performed with two staff, results unremarkable. Vital signs notable for hypertension, otherwise unremarkable. No reported or observed physiological concerns or abnormalities. Patient unable to fully engage with assessment due to somnolence. Patient speech notably delayed and soft. Patient orientated to facility, unit and room. All questions and concerns addressed at this time.  Patient stated reason for admission: Patient reports he was here just to talk about something and feels he already has. Patient also expresses belief that he has been poisoned over multiple events.  Patient presentation remarkable for: Somnolence, poor interaction and eye contact. Difficulty understanding and answering questions.  Patient history remarkable for: Family reports 2-3 days of catatonic behavior. Patient reported his chest hurt once in the ED and worried it was his aorta. PSUB, psychosis, methadone  use. Reports COC use but UDS only positive for BZO and THC.

## 2024-08-05 NOTE — Tx Team (Signed)
 Initial Treatment Plan 08/05/2024 4:04 PM Matthew Roach FMW:[id number]    PATIENT STRESSORS: Other: I don't know     PATIENT STRENGTHS: Other: Respectful   PATIENT IDENTIFIED PROBLEMS: I came here to talk about something, but I did already. Patient also reports being poisoned over multiple events.                     DISCHARGE CRITERIA:    PRELIMINARY DISCHARGE PLAN:   PATIENT/FAMILY INVOLVEMENT: This treatment plan has been presented to and reviewed with the patient, Matthew Roach, and/or family member.  The patient and family have been given the opportunity to ask questions and make suggestions.  [name], RN 08/05/2024, 4:04 PM

## 2024-08-05 NOTE — Plan of Care (Signed)
   Problem: Education: Goal: Knowledge of [facility] Education information/materials will improve Outcome: Progressing Goal: Verbalization of understanding the information provided will improve Outcome: Progressing

## 2024-08-05 NOTE — Progress Notes (Signed)
 Pt very paranoid and appearing confused at times, pt not receptive to information at times. Pt stated he did not know why he was here after several staff explained why he was admitted. Pt refused his HS medications, pt agreed to take Vistaril  and Seroquel  , pt appeared to take the medication, but may have cheeked the Medicine .

## 2024-08-05 NOTE — ED Provider Notes (Signed)
 Emergency Medicine Observation Re-evaluation Note  Matthew Roach is a 38 y.o. male, seen on rounds today.  Pt initially presented to the ED for complaints of Psychiatric Evaluation Currently, the patient is resting comfortably.  Physical Exam  BP (!) 142/85 (BP Location: Right Arm)   Pulse 96   Temp 98.3 F (36.8 C)   Resp 16   Ht 6' 2 (1.88 m)   SpO2 98%   BMI 25.06 kg/m  Physical Exam General: nad Cardiac: good peripheral perfusion Lungs: bilateral chest rise Psych: resting comfortably  ED Course / MDM  EKG:EKG Interpretation Date/Time:  Sunday August 04 2024 08:39:06 EDT Ventricular Rate:  93 PR Interval:  161 QRS Duration:  89 QT Interval:  330 QTC Calculation: 411 R Axis:   -2  Text Interpretation: Sinus rhythm Confirmed by Francesca Fallow ([id number]) on 08/04/2024 9:25:14 AM  I have reviewed the labs performed to date as well as medications administered while in observation.  Recent changes in the last 24 hours include Seen by psych recommend inpatient.  Plan  Current plan is for Inpatient psych placement.    Emil Share, DO 08/05/24 [phone]

## 2024-08-05 NOTE — ED Notes (Signed)
 Informed pt need for urine for UDS. Pt attempted but couldn't void, provided pt with water, will continue to encourage.

## 2024-08-05 NOTE — ED Notes (Signed)
 Pt aware we still need a UDS, he is drinking more water to see if he can provide one.

## 2024-08-05 NOTE — Group Note (Signed)
 Date:  08/05/2024 Time:  9:49 PM  Group Topic/Focus:  Wrap-Up Group:   The focus of this group is to help patients review their daily goal of treatment and discuss progress on daily workbooks.    Participation Level:  Did Not Attend  Participation Quality:  Did not attend  Affect:  Did not attend  Cognitive:  Did not attend  Insight: None  Engagement in Group:  None  Modes of Intervention:  Did not attend   Additional Comments:    Leigh VEAR Pais 08/05/2024, 9:49 PM

## 2024-08-05 NOTE — ED Notes (Signed)
 Pt in bathroom attempting to obtain an UDS.

## 2024-08-05 NOTE — Progress Notes (Signed)
(  Sleep Hours) -6  (Any PRNs that were needed, meds refused, or side effects to meds)- Refused lorazepam   (Any disturbances and when (visitation, over night)-none  (Concerns raised by the patient)- c/o CP'. Agrees same as at ER earlier. However, pointed to under left arm, rates 10 on 0-10 pain scale, unable to tell RN if pain sharp, dull, etc and if intermittent or constant. Skin W&D. Denied SHOB, nausea. Admits pain increases with movement and deep inspiration. Patient initially admits CP all day. RN did explain to patient chest issues ruled out in ER. At this point, patient denies ongoing CP at present and states something just doesn't add up. I need to leave. I need an EMT to come get me. States doesn't understand why he's here and wants to talk with family about IVC status. Explained will be able to talk with family in am, it's late tonight and nothing can be changed. RN did reorient patient to place, time and situation. Offered patient lorazepam  as per provider order to assist with patient anxiety. Adamantly refuses to take Benzo. Advised patient to speak about all of his issues with the dr in the am. Verbalized understanding. States going to lie down in his bed. Verbalized understanding to notify this RN for needs/discomfort. Will continue to monitor for discomfort/needs.  (SI/HI/AVH)- denied

## 2024-08-05 NOTE — ED Notes (Signed)
 [id number] called for IVC transport to [facility].

## 2024-08-06 DIAGNOSIS — F19921 Other psychoactive substance use, unspecified with intoxication with delirium: Secondary | ICD-10-CM

## 2024-08-06 DIAGNOSIS — T50905A Adverse effect of unspecified drugs, medicaments and biological substances, initial encounter: Secondary | ICD-10-CM | POA: Insufficient documentation

## 2024-08-06 DIAGNOSIS — F1320 Sedative, hypnotic or anxiolytic dependence, uncomplicated: Principal | ICD-10-CM | POA: Insufficient documentation

## 2024-08-06 DIAGNOSIS — F1394 Sedative, hypnotic or anxiolytic use, unspecified with sedative, hypnotic or anxiolytic-induced mood disorder: Principal | ICD-10-CM | POA: Insufficient documentation

## 2024-08-06 MED ORDER — METHADONE HCL 10 MG PO TABS
110.0000 mg | ORAL_TABLET | Freq: Every day | ORAL | Status: DC
  Administered 2024-08-06 – 2024-08-20 (×15): 110 mg via ORAL
  Filled 2024-08-06 (×15): qty 11

## 2024-08-06 MED ORDER — QUETIAPINE FUMARATE 100 MG PO TABS: 100.0000 mg | ORAL_TABLET | Freq: Every day | ORAL | Status: DC

## 2024-08-06 MED ORDER — OLANZAPINE 5 MG PO TABS
5.0000 mg | ORAL_TABLET | Freq: Every day | ORAL | Status: DC
  Filled 2024-08-06: qty 1

## 2024-08-06 MED ORDER — DIAZEPAM 5 MG PO TABS
5.0000 mg | ORAL_TABLET | Freq: Three times a day (TID) | ORAL | Status: DC
  Administered 2024-08-06 – 2024-08-07 (×3): 5 mg via ORAL
  Filled 2024-08-06 (×4): qty 1
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
## 2024-08-06 NOTE — Group Note (Signed)
 Date:  08/06/2024 Time:  8:52 PM  Group Topic/Focus:  Wrap-Up Group:   The focus of this group is to help patients review their daily goal of treatment and discuss progress on daily workbooks.    Additional Comments:   Pt was encouraged, but opted out of attending wrap up group this evening.   Matthew Roach Pouch 08/06/2024, 8:52 PM
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
## 2024-08-06 NOTE — Progress Notes (Signed)
   08/06/24 0800  Psych Admission Type (Psych Patients Only)  Admission Status Involuntary  Psychosocial Assessment  Patient Complaints Confusion;Restlessness;Anxiety  Eye Contact Poor  Facial Expression Blank  Affect Apprehensive  Speech Slow  Interaction Guarded  Motor Activity Pacing  Appearance/Hygiene Disheveled  Behavior Characteristics Pacing;Fidgety  Mood Suspicious  Thought Process  Coherency Blocking  Content Paranoia  Delusions Paranoid  Perception Derealization  Hallucination None reported or observed  Judgment Poor  Confusion Mild  Danger to Self  Current suicidal ideation? Denies  Agreement Not to Harm Self Yes  Description of Agreement verbl  Danger to Others  Danger to Others None reported or observed

## 2024-08-06 NOTE — Progress Notes (Signed)
 Patient took methadone  tabs, then proceeded to spit them out. Pt states Im supposed to take valium . Pt refused to take other medications as well, MD notified.

## 2024-08-06 NOTE — Progress Notes (Signed)
 Pt has been pacing the halls and peeking into other pt's room while pacing. Writer asked pt if they were okay and if they needed anything, in which pt responded no and insisted that this is not a real hospital, so they will not be taking any medication while at this facility. Writer has needed two nurses for two different encounters with pt not being redirectable on the unit. Pt has been sitting directly behind the door as writer has done q15 min checks, in which pt jumps up from the floor on each check. Pt has been disrupting their roommate who was having trouble sleeping with pt pacing the room and sitting at the door. Pt's roommate was moved to another room and a do not admit order has been placed on this pt.

## 2024-08-06 NOTE — Group Note (Signed)
 Recreation Therapy Group Note   Group Topic:Animal Assisted Therapy   Group Date: 08/06/2024 Start Time: 0947 End Time: 1030 Facilitators: Jaquavious Mercer-McCall, LRT,CTRS Location: [address] Hall Dayroom   Animal-Assisted Activity (AAA) Program Checklist/Progress Notes Patient Eligibility Criteria Checklist & Daily Group note for Rec Tx Intervention  AAA/T Program Assumption of Risk Form signed by Patient/ or Parent Legal Guardian Yes  Patient understands his/her participation is voluntary Yes  Behavioral Response:    Education: Charity fundraiser, Appropriate Animal Interaction   Education Outcome: Acknowledges education.    Affect/Mood: N/A   Participation Level: Did not attend    Clinical Observations/Individualized Feedback:     Plan: Continue to engage patient in RT group sessions 2-3x/week.   Matthew Roach, LRT,CTRS  08/06/2024 12:39 PM

## 2024-08-06 NOTE — H&P (Addendum)
 [facility] Psychiatric Adult H&P  Patient Identification: Matthew Roach MRN:  [id number] Date of Evaluation:  08/06/2024 Chief Complaint:  Bipolar 1 disorder (HCC) [F31.9] Principal Diagnosis: Sedative, hypnotic or anxiolytic-induced mood disorder (HCC) Diagnosis:  Principal Problem:   Sedative, hypnotic or anxiolytic-induced mood disorder (HCC) Active Problems:   Delirium, drug-induced    Subjective  CC: I'm confused  Matthew Roach is a 38 y.o. male  with a past psychiatric history of bipolar disorder with psychotic features, opioid use disorder, anxiety disorder. Patient initially arrived to [facility] on 9/14 for chest pain, and admitted to [facility] under IVC on 9/15 for stabilization of acute on chronic psychiatric conditions. PMHx is significant for seizures.   HPI:  Per chart review:  Pt was admitted to [facility] 03/13/2024: Patient admitted for bipolar disorder with mania and psychotic features with threatening gestures, suicidal statements, auditory hallucinations, and risky behavior of driving 879 miles an hour.  At this time he was on Prozac  and Remeron.  Family history of bipolar disorder.  Opiate addiction started 15 years ago after an injury to his neck.  This led to heroin use.  Patient has now been on Suboxone for several years.  Patient also uses THC Gummies.    Patient was discharged on diazepam  20 mg twice daily, lithium 900 mg nightly, trazodone  50 mg as needed for sleep, olanzapine  15 mg nightly, melatonin 3 mg nightly, methadone  115 mg daily  Patient appears to not be taking his lithium.  Patient was referred for outpatient psychiatry, but has had trouble getting established.  Patient had worsening trouble with anxiety; PCP Dr. Kennyth restarted Prozac  with warning of recurrence of manic symptoms to both patient and his brother.  Started Seroquel  50mg  at night to help with anxiety, insomnia, and mania.  PCP tried to work him off of Valium  to Klonopin ,  however this was not effective and Valium  has been continued.  Initial patient interview 9/16: Pt appears to be a poor historian. Patient pacing the halls with his shirt inside out appearing confused.  Patient concerned about the program director at the methadone  clinic being a member of gang that may be doing something with his medications after there was concern about whether there were cameras in the bathroom at the methadone  clinic.  He is not certain about whether or not he has been poisoned, however he just feels that something is wrong.  When asked about his home medications, he gives mixed information mixing doses of methadone  for Valium , also saying there is Xanax involved, and not being sure about the lithium that he has been prescribed.  He acknowledges that he is very confused, being unsure also if his living situation not knowing where he lives or the people that he lives with.  When asked about hallucinations, he is not sure whether he sees or hears things that are not really there, but does not have any examples of what might be questionably real.  Denies anxiety or depression, but remembers that anxiety is something that is normally a very big problem for him.  Reports a history of seizures.  Expresses his desire to reduce his Valium  from 40 mg daily, but says that the doctor said that they should increase it.    Gives authorization for us  to contact any collateral that we would like.  He is unsure who would be a good person to contact.  Collateral Information:  Per chart review on 9/14 from [facility]: Past psychiatric hospitalizations include being  at [facility] for 3 days and [facility] for 3 weeks. Pt was establish with [facility] via telehealth, but it did not go well. Pt has been acting like a zombie for 3 days, and complaining of chest pain for 2 days. Spoke with brother Matthew Roach [phone]): Brother was helpful and eager to provide details.  Patient initial struggles began in 2012 2013 when  they lived in [place]  and patient was using drugs at that time.  Seizures were occurring at that time as well and he knows that Xanax was definitely a part of that picture.  It is unclear whether the seizure disorder is primary or secondary to withdrawals. Paranoia has been a primary factor for the patient which led to his first admission to [facility] several months ago.  That admission was not helpful, he was discharged after a few days and his paranoia returned with thoughts that his father might be recording him.  Readmission to [facility] was helpful, and patient was able to hold a job after discharge.  He was never set up with a psychiatrist in person however, but had a virtual appointment with [employer].  Medication adjustments were made during this time patient appeared to get worse.  With this worsening, they decided to change providers.  They went to Dr. Kennyth because this is the brother's PCP whom he trusts.  Dr. Kennyth has been basing his initial assessment off of the notes from [facility], but patient appears to be getting worse again.   Patient has been staying with his mom and dad who help him with his medications and verify that he does take them.  However there were a couple of days where he was feeling much more sluggish and not his normal self.  Patient has insight and often voices his concern with his deteriorating mental state and would like to get better.  He is not sure what exactly is wrong but he is willing to get help.  Collateral is unsure of the details of the doses of the benzodiazepines or other medications.    Past Psychiatric Hx: Referral has been placed for psychiatry, but care has not been established. Psychiatric management post [facility] hospitalization has been handled by PCP in the meantime.  Substance Use Hx: Admission UDS + for Benzodiazepines, THC, and Methadone .  Past Medical History: Seizure history, unclear if this was from Benzo withdrawal or not.  Family  History: family history includes Diabetes in his father and mother; Heart disease in his father; Hyperlipidemia in his father; Hypertension in his father.  Pt also reports having a sister with schizophrenia. Chart review says he has sister with bipolar disorder.  Social History: Pt lives with parents and brother who are very supportive and verify he takes his medications.  Legal: denies any upcoming court dates.   Access to firearms: Unsure   Total Time spent with patient: 30 minutes  Is the patient at risk to self? Yes.    Has the patient been a risk to self in the past 6 months? Yes.    Has the patient been a risk to self within the distant past? Yes.    Is the patient a risk to others? No.  Has the patient been a risk to others in the past 6 months? No.  Has the patient been a risk to others within the distant past? No.    Tobacco Screening:  Social History   Tobacco Use  Smoking Status Never  Smokeless Tobacco Never  BH Tobacco Counseling     Are you interested in Tobacco Cessation Medications?  No, patient refused Counseled patient on smoking cessation:  Refused/Declined practical counseling Reason Tobacco Screening Not Completed: Patient Refused Screening       Social History:  Social History   Substance and Sexual Activity  Alcohol Use No     Social History   Substance and Sexual Activity  Drug Use Not Currently   Types: Marijuana   Comment: in past      Objective  Labs  EKG monitoring: QTcB: on 08/04/2024  Metabolism / endocrine: BMI: Body mass index is 25.09 kg/m.  CBC: unremarkable CMP: unremarkable UDS: + for Benzodiazepines, THC, and Methadone  Ethanol: <15 TSH: Normal in April 2025 A1c: 5.2 in April 2025 Lipid panel: Elevated LDL April 2025  Mental Status Exam  Apperance: BIZARRE, Sitting upright, and PACING Behavior: Calm and Psychomotor Agitation Speech: Normal Rate, Articulate, Normal Volume, and Responsive Attitude:  Cooperative and Friendly Mood: confused Affect: ANXIOUS, RESTRICTED, and Mood INCONGRUENT Perception: Not responding to internal stimuli Thought Content: within normal limits Thought Form: Goal Directed, DISORGANIZED, and Logical Cognition: Alert & Oriented to person, place, and time Judgment: POOR Insight: POOR   Physical Exam: Physical Exam Vitals and nursing note reviewed. Exam conducted with a chaperone present.  Constitutional:      General: He is not in acute distress.    Appearance: He is not toxic-appearing.  Pulmonary:     Effort: Pulmonary effort is normal. No respiratory distress.  Skin:    General: Skin is warm and dry.  Neurological:     Mental Status: He is alert.     Gait: Gait normal.    Review of Systems  Constitutional: Negative.  Negative for fever.  Respiratory: Negative.  Negative for shortness of breath.   Cardiovascular: Negative.   Gastrointestinal: Negative.  Negative for constipation and diarrhea.   Blood pressure (!) 127/93, pulse 94, temperature 97.9 F (36.6 C), resp. rate 16, height 6' (1.829 m), weight 83.9 kg, SpO2 100%. Body mass index is 25.09 kg/m.  Allergies:   Allergies  Allergen Reactions   Pork Allergy Other (See Comments)    Religious/cultural restriction   Lab Results:  Results for orders placed or performed during the hospital encounter of 08/04/24 (from the past 48 hours)  Urine Drug Screen     Status: Abnormal   Collection Time: 08/05/24  1:10 PM  Result Value Ref Range   Opiates NEGATIVE NEGATIVE   Cocaine NEGATIVE NEGATIVE   Benzodiazepines POSITIVE (A) NEGATIVE   Amphetamines NEGATIVE NEGATIVE   Tetrahydrocannabinol POSITIVE (A) NEGATIVE   Barbiturates NEGATIVE NEGATIVE   Methadone  Scn, Ur POSITIVE (A) NEGATIVE   Fentanyl  NEGATIVE NEGATIVE    Comment: (NOTE) Drug screen is for Medical Purposes only. Positive results are preliminary only. If confirmation is needed, notify lab within 5 days.  Drug Class                  Cutoff (ng/mL) Amphetamine and metabolites 1000 Barbiturate and metabolites 200 Benzodiazepine              200 Opiates and metabolites     300 Cocaine and metabolites     300 THC                         50 Fentanyl                     5 Methadone   300  Trazodone  is metabolized in vivo to several metabolites,  including pharmacologically active m-CPP, which is excreted in the  urine.  Immunoassay screens for amphetamines and MDMA have potential  cross-reactivity with these compounds and may provide false positive  result.  Performed at [facility], [address], [place], [place] [address]     Blood Alcohol level:  Lab Results  Component Value Date   [facility] <15 08/04/2024    Metabolic Disorder Labs:  Lab Results  Component Value Date   HGBA1C 5.9 12/21/2023   No results found for: PROLACTIN Lab Results  Component Value Date   CHOL 298 (H) 12/21/2023   TRIG 291.0 (H) 12/21/2023   HDL 40.20 12/21/2023   CHOLHDL 7 12/21/2023   VLDL 58.2 (H) 12/21/2023   LDLCALC 200 (H) 12/21/2023    [place] Scale:  Flowsheet Row [facility] ([facility]) [facility] from 01/24/2024 in [facility] at [facility] ED from 01/22/2024 in [facility] Department at [facility]  C-SSRS RISK CATEGORY No Risk No Risk No Risk    Current Medications: Current Facility-Administered Medications  Medication Dose Route Frequency Provider Last Rate Last Admin   acetaminophen  (TYLENOL ) tablet 650 mg  650 mg Oral Q6H PRN Motley-Mangrum, Jadeka A, PMHNP       alum & mag hydroxide-simeth (MAALOX/MYLANTA) 200-200-20 MG/5ML suspension 30 mL  30 mL Oral Q4H PRN Motley-Mangrum, Jadeka A, PMHNP       diazepam  (VALIUM ) tablet 5 mg  5 mg Oral TID Towana Leita SAILOR, MD   5 mg at 08/06/24 1302   haloperidol  (HALDOL ) tablet 5 mg  5 mg Oral TID PRN Motley-Mangrum, Jadeka A, PMHNP        And   diphenhydrAMINE  (BENADRYL ) capsule 50 mg  50 mg Oral TID PRN Motley-Mangrum, Jadeka A, PMHNP       haloperidol  lactate (HALDOL ) injection 5 mg  5 mg Intramuscular TID PRN Motley-Mangrum, Jadeka A, PMHNP       And   diphenhydrAMINE  (BENADRYL ) injection 50 mg  50 mg Intramuscular TID PRN Motley-Mangrum, Jadeka A, PMHNP       And   LORazepam  (ATIVAN ) injection 2 mg  2 mg Intramuscular TID PRN Motley-Mangrum, Jadeka A, PMHNP       haloperidol  lactate (HALDOL ) injection 10 mg  10 mg Intramuscular TID PRN Motley-Mangrum, Jadeka A, PMHNP       And   diphenhydrAMINE  (BENADRYL ) injection 50 mg  50 mg Intramuscular TID PRN Motley-Mangrum, Jadeka A, PMHNP       And   LORazepam  (ATIVAN ) injection 2 mg  2 mg Intramuscular TID PRN Motley-Mangrum, Jadeka A, PMHNP       hydrOXYzine  (ATARAX ) tablet 25 mg  25 mg Oral TID PRN Motley-Mangrum, Jadeka A, PMHNP   25 mg at 08/05/24 2015   hydrOXYzine  (ATARAX ) tablet 25 mg  25 mg Oral Q6H PRN Pashayan, Alexander S, DO       loperamide  (IMODIUM ) capsule 2-4 mg  2-4 mg Oral PRN Pashayan, Alexander S, DO       LORazepam  (ATIVAN ) tablet 1 mg  1 mg Oral Q6H PRN Pashayan, Alexander S, DO       magnesium  hydroxide (MILK OF MAGNESIA) suspension 30 mL  30 mL Oral Daily PRN Motley-Mangrum, Jadeka A, PMHNP       methadone  (DOLOPHINE ) tablet 110 mg  110 mg Oral Daily Towana Leita SAILOR, MD   110 mg at 08/06/24 [phone]  multivitamin with minerals tablet 1 tablet  1 tablet Oral Daily Pashayan, Alexander S, DO       ondansetron  (ZOFRAN -ODT) disintegrating tablet 4 mg  4 mg Oral Q6H PRN Pashayan, Alexander S, DO       QUEtiapine  (SEROQUEL ) tablet 100 mg  100 mg Oral QHS Cruzita Lipa B, DO       thiamine  (Vitamin B-1) tablet 100 mg  100 mg Oral Daily Pashayan, Alexander S, DO       thiamine  (VITAMIN B1) injection 100 mg  100 mg Intramuscular Once Pashayan, Alexander S, DO       traZODone  (DESYREL ) tablet 100 mg  100 mg Oral QHS PRN Trudy Carwin, NP       PTA  Medications: Medications Prior to Admission  Medication Sig Dispense Refill Last Dose/Taking   clonazePAM  (KLONOPIN ) 1 MG tablet Take 1 mg by mouth 2 (two) times daily. (Patient not taking: Reported on 08/04/2024)      diazepam  (VALIUM ) 10 MG tablet Take 2 tablets (20 mg total) by mouth every 12 (twelve) hours as needed for anxiety. 120 tablet 5    FLUoxetine  (PROZAC ) 20 MG capsule Take 1 capsule (20 mg total) by mouth daily. 90 capsule 1    lithium carbonate (ESKALITH) 450 MG ER tablet Take 450 mg by mouth 2 (two) times daily. (Patient not taking: Reported on 08/04/2024)      methadone  (DOLOPHINE ) 10 MG/ML solution Take 125 mg by mouth daily.      mirtazapine (REMERON) 15 MG tablet Take 15 mg by mouth at bedtime. (Patient not taking: Reported on 08/04/2024)      OLANZapine  (ZYPREXA ) 2.5 MG tablet Take 2.5 mg by mouth at bedtime. (Patient not taking: Reported on 08/04/2024)      QUEtiapine  (SEROQUEL ) 50 MG tablet TAKE 1 TABLET BY MOUTH EVERYDAY AT BEDTIME 90 tablet 1    traZODone  (DESYREL ) 50 MG tablet Take 50 mg by mouth at bedtime. (Patient not taking: Reported on 08/04/2024)       Musculoskeletal: Strength & Muscle Tone: within normal limits Gait & Station: normal Patient leans: N/A  Psychiatric Specialty Exam:  Presentation  General Appearance: Bizarre  Eye Contact:Fleeting  Speech:Clear and Coherent  Speech Volume:Normal  Handedness:No data recorded  Mood and Affect  Mood:-- (confused)  Affect:Other (comment) (anxious)   Thought Process  Thought Processes:Coherent  Descriptions of Associations:Tangential  Orientation:Full (Time, Place and Person)  Thought Content:WDL  History of Schizophrenia/Schizoaffective disorder:No data recorded  Hallucinations:Hallucinations: None  Ideas of Reference:Paranoia  Suicidal Thoughts:Suicidal Thoughts: No  Homicidal Thoughts:Homicidal Thoughts: No   Sensorium  Memory:Immediate Poor; Recent Poor; Remote  Poor  Judgment:Poor  Insight:Poor   Executive Functions  Concentration:Fair  Attention Span:No data recorded Recall:No data recorded Fund of Knowledge:No data recorded Language:No data recorded  Psychomotor Activity  Psychomotor Activity:Psychomotor Activity: Normal   Assets  Assets:Desire for Improvement; Housing; Physical Health; Social Support   Sleep  Sleep:Sleep: Fair    Assessment & Plan    Diagnoses / Active Problems:  Principal Problem:   Sedative, hypnotic or anxiolytic-induced mood disorder (HCC) Active Problems:   Delirium, drug-induced  Kellis Mcadam is a 38 y.o. male  with a past psychiatric history of bipolar disorder with psychotic features, opioid use disorder, anxiety disorder. Patient initially arrived to [facility] on 9/14 for chest pain, and admitted to [facility] under IVC on 9/15 for stabilization of acute on chronic psychiatric conditions. PMHx is significant for seizures.   Patient appears to be confused to a point of  not being able to care for himself and requires ongoing IVC.  Has insight as to something being wrong, but is unsure exactly what it is.  Seems to go mostly trust this hospital setting.  40 mg of Valium  daily is a significant problem that we need to decrease.  History of seizures goes back to 2012 and 2013 and might be related to benzo withdrawals.  When patient is treated well, he is able to hold a job.  Past record of effective medications comes from his discharge notes from [facility] which are listed above.    Diagnosis is unclear at this point.  Patient likely has a bipolar disorder based on past history, however his current presentation is more delirious than manic.  Regular evaluations of the patient, comparing nursing notes with his interactions in group, and any other notes should be looked at for waxing and waning.  Decreasing his 40 mg of Valium  may also help him to have a clear mind.  Seroquel  that was started by the PCP is a good  option, but is underdosed at 50 mg.  Onset of all of this also occurred when brother, who appears to be primary on helping him, was out of the country and he was in the care of his mom and dad who report that they were verifying his medications.     PLAN: Decrease his benzodiazepines, increase his Seroquel , look for waxing and waning, be cautious of benzodiazepine withdrawal seizures.  # Delirium # Sedative, hypnotic or anxiolytic-induced mood disorder  - Reduce Valium  to 5mg  TID  - Continue Methadone  110mg  daily  - Stop Prozac  20mg  daily   Pt has bipolar disorder, and this was added recently to no known benefit.  - Stop Seroquel  50mg  nightly   Replacing with Olanzapine   - Start Olanzapine  5mg  nightly   Pt was stable on Olanzapine  15mg  nightly at discharge from [facility], and when asked if her prefer the Olanzapine  or Seroquel , he said he had no preferrence and would be good to try the Olanzapine  again.   QTcB , Lipid panel somewhat elevated in April. A1c nonconcerning in April  - Continue IVC for concern of self-neglect from serious confusion putting pt at non-suicidal danger of harm to self   - Submitted 9/16, due 9/23   Psychiatry General PRNs  -- Trazodone  50 mg once nightly as needed for insomnia  -- Hydroxyzine  25 mg 3 times daily as needed for anxiety  -- Agitation protocol: Haldol , Benadryl , lorazepam    Medical Issues Being Addressed:  #none   Medical General PRNs  - Tylenol  tablets 650 mg every 6 hours as needed for pain - Maalox/Mylanta suspension 30 mL every 4 hours as needed for indigestion - Milk of Magnesia 30 mL daily as needed for constipation   Discharge Planning:              -- Social work and case management to assist with discharge planning and identification of hospital follow-up needs prior to discharge             -- Estimated LOS: 7-10 days              -- Discharge Concerns: Need to establish a safety plan; Medication compliance and  effectiveness             -- Discharge Goals: Return home with outpatient referrals for mental health follow-up including medication management/psychotherapy   Safety and Monitoring:             --  Involuntary admission to inpatient psychiatric unit for safety, stabilization and treatment             -- Daily contact with patient to assess and evaluate symptoms and progress in treatment             -- Patient's case to be discussed in multi-disciplinary team meeting             -- Observation Level : q15 minute checks             -- Vital signs:  q12 hours             -- Precautions: suicide, elopement, and assault  Group Therapy Encouraged patient to participate in unit milieu and in scheduled group therapies  -- Short Term Goals: Ability to identify changes in lifestyle to reduce recurrence of condition will improve, Ability to verbalize feelings will improve, Ability to disclose and discuss suicidal ideas, Ability to demonstrate self-control will improve, Ability to identify and develop effective coping behaviors will improve, Ability to maintain clinical measurements within normal limits will improve, Compliance with prescribed medications will improve, and Ability to identify triggers associated with substance abuse/mental health issues will improve. -- Long Term Goals: Improvement in symptoms so as ready for discharge.  The risks/benefits/side-effects/alternatives to medication were discussed in detail with the patient and time was given for questions. The patient consents to medication trial.  Metabolic profile and EKG monitoring obtained while on an atypical antipsychotic.              I certify that inpatient services furnished can reasonably be expected to improve the patient's condition.   CHARM Penne Mori Psychiatry  PGY-1 08/06/2024, 3:05 PM

## 2024-08-06 NOTE — Group Note (Signed)
 Date:  08/06/2024 Time:  5:19 PM  Group Topic/Focus:  Emotional Education:   The focus of this group is to discuss what feelings/emotions are, and how they are experienced.    Participation Level:  Did Not Attend   Matthew Roach 08/06/2024, 5:19 PM

## 2024-08-06 NOTE — Plan of Care (Signed)
  Problem: Education: Goal: Knowledge of [place] [facility] Education information/materials will improve Outcome: Progressing Goal: Verbalization of understanding the information provided will improve Outcome: Progressing   Problem: Activity: Goal: Interest or engagement in activities will improve Outcome: Progressing   Problem: Coping: Goal: Ability to demonstrate self-control will improve Outcome: Progressing

## 2024-08-06 NOTE — Group Note (Signed)
 LCSW Group Therapy Note   Group Date: 08/06/2024 Start Time: 1100 End Time: 1200   Participation:  patient arrived less than 15 minutes before the sessions ended.  He listened but didn't participate in the discussion.  Type of Therapy:  Group Therapy  Topic:  Healing From Within: Understanding Our Past, Building Our Future"  Objective:  To help participants understand the impact of early experiences on mental and physical health, with a focus on Adverse Childhood Experiences (ACEs), and to explore ways to build resilience and healing.  Goals: Understand ACEs and Their Impact: Learn how childhood experiences shape mental and physical health. Build Resilience: Develop strategies for overcoming challenges and creating positive change. Promote Healing: Recognize the value of support and the possibility of healing through therapy and self-care.  Summary:  In today's session, we discussed how early experiences, especially ACEs, impact mental and physical health. We explored the effects of stress, abuse, and neglect on brain development and well-being. The group focused on resilience, understanding that healing and positive change are possible with support and self-awareness.  Therapeutic Modalities Used: Psychoeducation: Sharing information about ACEs and their effects. Cognitive Behavioral Therapy (CBT): Helping reframe negative thought patterns. Trauma-Informed Therapy: Creating a safe, supportive space for healing.   Ece Cumberland O Pilar Westergaard, LCSWA 08/06/2024  12:34 PM

## 2024-08-07 ENCOUNTER — Encounter (HOSPITAL_COMMUNITY): Payer: Self-pay

## 2024-08-07 DIAGNOSIS — R410 Disorientation, unspecified: Secondary | ICD-10-CM

## 2024-08-07 MED ORDER — LORAZEPAM 1 MG PO TABS
1.0000 mg | ORAL_TABLET | Freq: Three times a day (TID) | ORAL | Status: DC
  Administered 2024-08-08: 1 mg via ORAL
  Filled 2024-08-07 (×2): qty 1

## 2024-08-07 MED ORDER — LORAZEPAM 2 MG/ML IJ SOLN
1.0000 mg | Freq: Three times a day (TID) | INTRAMUSCULAR | Status: DC
  Administered 2024-08-07: 1 mg via INTRAMUSCULAR
  Filled 2024-08-07: qty 1

## 2024-08-07 MED ORDER — LORAZEPAM 2 MG/ML IJ SOLN: 1.0000 mg | Freq: Three times a day (TID) | INTRAMUSCULAR | Status: DC

## 2024-08-07 MED ORDER — OLANZAPINE 5 MG PO TABS
5.0000 mg | ORAL_TABLET | Freq: Two times a day (BID) | ORAL | Status: DC
  Administered 2024-08-08 – 2024-08-10 (×5): 5 mg via ORAL
  Filled 2024-08-07 (×6): qty 1

## 2024-08-07 MED ORDER — HALOPERIDOL LACTATE 5 MG/ML IJ SOLN
5.0000 mg | Freq: Two times a day (BID) | INTRAMUSCULAR | Status: DC
  Administered 2024-08-07: 5 mg via INTRAMUSCULAR
  Filled 2024-08-07: qty 1

## 2024-08-07 MED ORDER — LORAZEPAM 1 MG PO TABS
1.0000 mg | ORAL_TABLET | Freq: Three times a day (TID) | ORAL | Status: DC
Start: 2024-08-07 — End: 2024-08-07

## 2024-08-07 NOTE — BH IP Treatment Plan (Signed)
 Interdisciplinary Treatment and Diagnostic Plan Update  08/07/2024 Time of Session: 10:20 AM Matthew Roach MRN: [id number]  Principal Diagnosis: Sedative, hypnotic or anxiolytic-induced mood disorder (HCC)  Secondary Diagnoses: Principal Problem:   Sedative, hypnotic or anxiolytic-induced mood disorder (HCC) Active Problems:   Bipolar 1 disorder (HCC)   Delirium, drug-induced   Current Medications:  Current Facility-Administered Medications  Medication Dose Route Frequency Provider Last Rate Last Admin   acetaminophen  (TYLENOL ) tablet 650 mg  650 mg Oral Q6H PRN Motley-Mangrum, Jadeka A, PMHNP       alum & mag hydroxide-simeth (MAALOX/MYLANTA) 200-200-20 MG/5ML suspension 30 mL  30 mL Oral Q4H PRN Motley-Mangrum, Jadeka A, PMHNP       haloperidol  (HALDOL ) tablet 5 mg  5 mg Oral TID PRN Motley-Mangrum, Jadeka A, PMHNP       And   diphenhydrAMINE  (BENADRYL ) capsule 50 mg  50 mg Oral TID PRN Motley-Mangrum, Jadeka A, PMHNP       haloperidol  lactate (HALDOL ) injection 5 mg  5 mg Intramuscular TID PRN Motley-Mangrum, Jadeka A, PMHNP       And   diphenhydrAMINE  (BENADRYL ) injection 50 mg  50 mg Intramuscular TID PRN Motley-Mangrum, Jadeka A, PMHNP       And   LORazepam  (ATIVAN ) injection 2 mg  2 mg Intramuscular TID PRN Motley-Mangrum, Jadeka A, PMHNP       haloperidol  lactate (HALDOL ) injection 10 mg  10 mg Intramuscular TID PRN Motley-Mangrum, Jadeka A, PMHNP       And   diphenhydrAMINE  (BENADRYL ) injection 50 mg  50 mg Intramuscular TID PRN Motley-Mangrum, Jadeka A, PMHNP       And   LORazepam  (ATIVAN ) injection 2 mg  2 mg Intramuscular TID PRN Motley-Mangrum, Jadeka A, PMHNP       OLANZapine  (ZYPREXA ) tablet 5 mg  5 mg Oral BID Prunty, Donald B, DO       Or   haloperidol  lactate (HALDOL ) injection 5 mg  5 mg Intramuscular BID Prunty, Donald B, DO       hydrOXYzine  (ATARAX ) tablet 25 mg  25 mg Oral TID PRN Motley-Mangrum, Jadeka A, PMHNP   25 mg at 08/05/24 2015   hydrOXYzine   (ATARAX ) tablet 25 mg  25 mg Oral Q6H PRN Pashayan, Alexander S, DO       loperamide  (IMODIUM ) capsule 2-4 mg  2-4 mg Oral PRN Pashayan, Alexander S, DO       LORazepam  (ATIVAN ) tablet 1 mg  1 mg Oral TID Prunty, Donald B, DO       Or   LORazepam  (ATIVAN ) injection 1 mg  1 mg Intravenous TID Prunty, Donald B, DO       LORazepam  (ATIVAN ) tablet 1 mg  1 mg Oral Q6H PRN Pashayan, Alexander S, DO       magnesium  hydroxide (MILK OF MAGNESIA) suspension 30 mL  30 mL Oral Daily PRN Motley-Mangrum, Jadeka A, PMHNP       methadone  (DOLOPHINE ) tablet 110 mg  110 mg Oral Daily Towana Leita SAILOR, MD   110 mg at 08/07/24 0836   multivitamin with minerals tablet 1 tablet  1 tablet Oral Daily Pashayan, Alexander S, DO       ondansetron  (ZOFRAN -ODT) disintegrating tablet 4 mg  4 mg Oral Q6H PRN Pashayan, Alexander S, DO       thiamine  (Vitamin B-1) tablet 100 mg  100 mg Oral Daily Pashayan, Alexander S, DO       thiamine  (VITAMIN B1) injection 100 mg  100 mg Intramuscular  Once Raliegh Marsa RAMAN, DO       traZODone  (DESYREL ) tablet 100 mg  100 mg Oral QHS PRN Trudy Carwin, NP       PTA Medications: Medications Prior to Admission  Medication Sig Dispense Refill Last Dose/Taking   clonazePAM  (KLONOPIN ) 1 MG tablet Take 1 mg by mouth 2 (two) times daily. (Patient not taking: Reported on 08/04/2024)      diazepam  (VALIUM ) 10 MG tablet Take 2 tablets (20 mg total) by mouth every 12 (twelve) hours as needed for anxiety. 120 tablet 5    FLUoxetine  (PROZAC ) 20 MG capsule Take 1 capsule (20 mg total) by mouth daily. 90 capsule 1    lithium carbonate (ESKALITH) 450 MG ER tablet Take 450 mg by mouth 2 (two) times daily. (Patient not taking: Reported on 08/04/2024)      methadone  (DOLOPHINE ) 10 MG/ML solution Take 125 mg by mouth daily.      mirtazapine (REMERON) 15 MG tablet Take 15 mg by mouth at bedtime. (Patient not taking: Reported on 08/04/2024)      OLANZapine  (ZYPREXA ) 2.5 MG tablet Take 2.5 mg by mouth at  bedtime. (Patient not taking: Reported on 08/04/2024)      QUEtiapine  (SEROQUEL ) 50 MG tablet TAKE 1 TABLET BY MOUTH EVERYDAY AT BEDTIME 90 tablet 1    traZODone  (DESYREL ) 50 MG tablet Take 50 mg by mouth at bedtime. (Patient not taking: Reported on 08/04/2024)       Patient Stressors: Other: I don't know    Patient Strengths: Other: Respectful  Treatment Modalities: Medication Management, Group therapy, Case management,  1 to 1 session with clinician, Psychoeducation, Recreational therapy.   Physician Treatment Plan for Primary Diagnosis: Sedative, hypnotic or anxiolytic-induced mood disorder (HCC) Long Term Goal(s):     Short Term Goals:    Medication Management: Evaluate patient's response, side effects, and tolerance of medication regimen.  Therapeutic Interventions: 1 to 1 sessions, Unit Group sessions and Medication administration.  Evaluation of Outcomes: Not Progressing  Physician Treatment Plan for Secondary Diagnosis: Principal Problem:   Sedative, hypnotic or anxiolytic-induced mood disorder (HCC) Active Problems:   Bipolar 1 disorder (HCC)   Delirium, drug-induced  Long Term Goal(s):     Short Term Goals:       Medication Management: Evaluate patient's response, side effects, and tolerance of medication regimen.  Therapeutic Interventions: 1 to 1 sessions, Unit Group sessions and Medication administration.  Evaluation of Outcomes: Not Progressing   RN Treatment Plan for Primary Diagnosis: Sedative, hypnotic or anxiolytic-induced mood disorder (HCC) Long Term Goal(s): Knowledge of disease and therapeutic regimen to maintain health will improve  Short Term Goals: Ability to remain free from injury will improve, Ability to verbalize frustration and anger appropriately will improve, Ability to demonstrate self-control, Ability to participate in decision making will improve, Ability to verbalize feelings will improve, Ability to disclose and discuss suicidal  ideas, Ability to identify and develop effective coping behaviors will improve, and Compliance with prescribed medications will improve  Medication Management: RN will administer medications as ordered by provider, will assess and evaluate patient's response and provide education to patient for prescribed medication. RN will report any adverse and/or side effects to prescribing provider.  Therapeutic Interventions: 1 on 1 counseling sessions, Psychoeducation, Medication administration, Evaluate responses to treatment, Monitor vital signs and CBGs as ordered, Perform/monitor CIWA, COWS, AIMS and Fall Risk screenings as ordered, Perform wound care treatments as ordered.  Evaluation of Outcomes: Not Progressing   LCSW Treatment Plan for Primary  Diagnosis: Sedative, hypnotic or anxiolytic-induced mood disorder (HCC) Long Term Goal(s): Safe transition to appropriate next level of care at discharge, Engage patient in therapeutic group addressing interpersonal concerns.  Short Term Goals: Engage patient in aftercare planning with referrals and resources, Increase social support, Increase ability to appropriately verbalize feelings, Increase emotional regulation, Facilitate acceptance of mental health diagnosis and concerns, Facilitate patient progression through stages of change regarding substance use diagnoses and concerns, Identify triggers associated with mental health/substance abuse issues, and Increase skills for wellness and recovery  Therapeutic Interventions: Assess for all discharge needs, 1 to 1 time with Social worker, Explore available resources and support systems, Assess for adequacy in community support network, Educate family and significant other(s) on suicide prevention, Complete Psychosocial Assessment, Interpersonal group therapy.  Evaluation of Outcomes: Not Progressing   Progress in Treatment: Attending groups: No. Participating in groups: No. Taking medication as prescribed:  No. Toleration medication: Yes. Family/Significant other contact made: consents are pending Patient understands diagnosis: No. Discussing patient identified problems/goals with staff: No. Medical problems stabilized or resolved: Yes. Denies suicidal/homicidal ideation: Yes. Issues/concerns per patient self-inventory: No.  New problem(s) identified:  No  New Short Term/Long Term Goal(s):    medication stabilization, elimination of SI thoughts, development of comprehensive mental wellness plan.    Patient Goals:  I'm not sure.  My memory is gone.   Discharge Plan or Barriers:  Patient recently admitted. CSW will continue to follow and assess for appropriate referrals and possible discharge planning.    Reason for Continuation of Hospitalization: Medication stabilization  Psychosis  Estimated Length of Stay:  5 - 7 days  Last 3 [place] Suicide Severity Risk Score: Flowsheet Row Ad[facility] (Current) [facility] from 01/24/2024 in [facility] Department at [facility] ED from 01/22/2024 in [facility] Emergency Department at [facility]  C-SSRS RISK CATEGORY No Risk No Risk No Risk    Last PHQ 2/9 Scores:    07/10/2024    9:43 AM 06/24/2024    2:00 PM 12/21/2023   11:50 AM  Depression screen PHQ 2/9  Decreased Interest 2 3 0  Down, Depressed, Hopeless 2 2 1   PHQ - 2 Score 4 5 1   Altered sleeping 2 3 0  Tired, decreased energy 2 3 1   Change in appetite 2 3 2   Feeling bad or failure about yourself  2 1 0  Trouble concentrating 2 2 0  Moving slowly or fidgety/restless 2 2 0  Suicidal thoughts 2 0 0  PHQ-9 Score 18 19 4   Difficult doing work/chores  Extremely dIfficult Somewhat difficult    Scribe for Treatment Team: Yaret Hush O Arles Rumbold, LCSWA 08/07/2024 4:26 PM

## 2024-08-07 NOTE — Consult Note (Signed)
 ID: Matthew Roach is a 38 y.o. male  with a past psychiatric history of bipolar disorder with psychotic features, opioid use disorder, anxiety disorder. Patient initially arrived to [facility] on 9/14 for chest pain, and admitted to [facility] under IVC on 9/15 for stabilization of acute on chronic psychiatric conditions.   Since admission the patient has remained paranoid, believing that staff is attempting to poison him with medications. Given his use of diazepam  prior to admission there is concern that without medication he may develop benzodiazepine withdrawal. Additionally, without treatment he is likely to remain severely impaired and required a protracted hospitalization, which is not in his best interest.   I concur with Dr. Towana that forced administration is both necessary and appropriate and in the best interest of the patient.   Oliva Salmon, DO

## 2024-08-07 NOTE — Progress Notes (Signed)
 [facility] Inpatient Psychiatry Progress Note  Date: 08/07/2024 Patient: Matthew Roach MRN: [id number]   Subjective  Matthew Roach is a 38 y.o. male  with a past psychiatric history of bipolar disorder with psychotic features, opioid use disorder, anxiety disorder. Patient initially arrived to [facility] on 9/14 for chest pain, and admitted to [facility] under IVC on 9/15 for stabilization of acute on chronic psychiatric conditions. PMHx is significant for seizures.   Interval Update  08/07/24  Patient refused evening and morning meds (except methadone  and valium ). Pt slept about 1.5 hours overnight, often jumping up from the floor and scaring staff who would check on him. Vital Signs are Stable w/ elevated respirations at 24. They received no PRN medications yesterday. Case was discussed in the multidisciplinary team.   On interview patient reports feeling concerned that something is off about this whole place, and he does not trust the medications. He says that he isn't sure that they are right because they taste too bitter. He then tested us  on proper treatment for alzheimer's disease to try and see if we were really physicians. Would not commit to taking medications, but not outright saying he wont take them. Presentation similar to yesterdays.    Objective  Vitals: Blood pressure (!) 143/85, pulse 88, temperature 98.3 F (36.8 C), temperature source Oral, resp. rate (!) 24, height 6' (1.829 m), weight 83.9 kg, SpO2 99%.   Physical Examination:  Physical Exam Vitals and nursing note reviewed. Exam conducted with a chaperone present.  Constitutional:      General: He is not in acute distress.    Appearance: He is normal weight.  Pulmonary:     Effort: Pulmonary effort is normal. No respiratory distress.  Skin:    General: Skin is warm and dry.  Neurological:     Mental Status: He is alert.     Gait: Gait normal.    Review of Systems  Constitutional: Negative.   Negative for fever.  Respiratory: Negative.  Negative for shortness of breath.   Cardiovascular: Negative.   Gastrointestinal: Negative.  Negative for constipation and diarrhea.     Musculoskeletal:  Strength & Muscle Tone: within normal limits Gait & Station: normal   Mental Status Exam  Apperance: BIZARRE and PACING Behavior: Calm Speech: Normal Rate, Articulate, Normal Volume, and Responsive Attitude: EVASIVE Mood: I don't know Affect: ANXIOUS Perception: Not responding to internal stimuli, DEPERSONALIZATION, and DEREALIZATION Thought Content: within normal limits and Delusions: Paranoid Thought Form: Goal Directed and DISORGANIZED Cognition: Alert & Oriented to person, place, and time Judgment: POOR Insight: POOR    Lab Results:   No visits with results within 1 Day(s) from this visit.  Latest known visit with results is:  Admission on 08/04/2024, Discharged on 08/05/2024  Component Date Value Ref Range Status   Sodium 08/04/2024 142  135 - 145 mmol/L Final   Potassium 08/04/2024 4.1  3.5 - 5.1 mmol/L Final   Chloride 08/04/2024 102  98 - 111 mmol/L Final   CO2 08/04/2024 24  22 - 32 mmol/L Final   Glucose, Bld 08/04/2024 105 (H)  70 - 99 mg/dL Final   BUN 90/85/7974 20  6 - 20 mg/dL Final   Creatinine, Ser 08/04/2024 0.82  0.61 - 1.24 mg/dL Final   Calcium 90/85/7974 9.6  8.9 - 10.3 mg/dL Final   Total Protein 90/85/7974 7.6  6.5 - 8.1 g/dL Final   Albumin 90/85/7974 4.8  3.5 - 5.0 g/dL Final   AST 90/85/7974 24  15 - 41 U/L Final   ALT 08/04/2024 17  0 - 44 U/L Final   Alkaline Phosphatase 08/04/2024 68  38 - 126 U/L Final   Total Bilirubin 08/04/2024 0.3  0.0 - 1.2 mg/dL Final   GFR, Estimated 08/04/2024 >60  >60 mL/min Final   Anion gap 08/04/2024 16 (H)  5 - 15 Final   WBC 08/04/2024 9.5  4.0 - 10.5 K/uL Final   RBC 08/04/2024 4.99  4.22 - 5.81 MIL/uL Final   Hemoglobin 08/04/2024 12.7 (L)  13.0 - 17.0 g/dL Final   HCT 90/85/7974 41.0  39.0 - 52.0 %  Final   MCV 08/04/2024 82.2  80.0 - 100.0 fL Final   MCH 08/04/2024 25.5 (L)  26.0 - 34.0 pg Final   MCHC 08/04/2024 31.0  30.0 - 36.0 g/dL Final   RDW 90/85/7974 15.0  11.5 - 15.5 % Final   Platelets 08/04/2024 298  150 - 400 K/uL Final   nRBC 08/04/2024 0.0  0.0 - 0.2 % Final   Neutrophils Relative % 08/04/2024 72  % Final   Neutro Abs 08/04/2024 6.8  1.7 - 7.7 K/uL Final   Lymphocytes Relative 08/04/2024 22  % Final   Lymphs Abs 08/04/2024 2.1  0.7 - 4.0 K/uL Final   Monocytes Relative 08/04/2024 5  % Final   Monocytes Absolute 08/04/2024 0.5  0.1 - 1.0 K/uL Final   Eosinophils Relative 08/04/2024 0  % Final   Eosinophils Absolute 08/04/2024 0.0  0.0 - 0.5 K/uL Final   Basophils Relative 08/04/2024 1  % Final   Basophils Absolute 08/04/2024 0.1  0.0 - 0.1 K/uL Final   Immature Granulocytes 08/04/2024 0  % Final   Abs Immature Granulocytes 08/04/2024 0.03  0.00 - 0.07 K/uL Final   Alcohol, Ethyl (B) 08/04/2024 <15  <15 mg/dL Final   Acetaminophen  (Tylenol ), Serum 08/04/2024 <10 (L)  10 - 30 ug/mL Final   Salicylate Lvl 08/04/2024 <7.0 (L)  7.0 - 30.0 mg/dL Final   Troponin T High Sensitivity 08/04/2024 <15  0 - 19 ng/L Final   Troponin T High Sensitivity 08/04/2024 <15  0 - 19 ng/L Final   Opiates 08/05/2024 NEGATIVE  NEGATIVE Final   Cocaine 08/05/2024 NEGATIVE  NEGATIVE Final   Benzodiazepines 08/05/2024 POSITIVE (A)  NEGATIVE Final   Amphetamines 08/05/2024 NEGATIVE  NEGATIVE Final   Tetrahydrocannabinol 08/05/2024 POSITIVE (A)  NEGATIVE Final   Barbiturates 08/05/2024 NEGATIVE  NEGATIVE Final   Methadone  Scn, Ur 08/05/2024 POSITIVE (A)  NEGATIVE Final   Fentanyl  08/05/2024 NEGATIVE  NEGATIVE Final      Assessment & Plan  Matthew Roach is a 38 y.o. male  with a past psychiatric history of bipolar disorder with psychotic features, opioid use disorder, anxiety disorder. Patient initially arrived to [facility] on 9/14 for chest pain, and admitted to [facility] under IVC on 9/15 for  stabilization of acute on chronic psychiatric conditions. PMHx is significant for seizures.    Patient presentation continues to be odd.  There is underlying paranoia about where he is leading to medication noncompliance.  The confused picture is not consistent with primary psychiatric diagnoses.  May be a substance-induced delirium, odd presentation for disorganized thought, a catatonic picture, or something else.  Chart documentation has strong evidence for an underlying bipolar disorder, which may or may not be contributing to this encounter.  Plan is roughly to continue the Valium  to help prevent seizures from withdrawal, stop offending agents, continues methadone , and get him on an antipsychotic.  IVC should stay in place as he cannot care for himself in this state.  It is unpredictable whether or not the psychotic component could lead to danger to others.   # Delirium # Sedative, hypnotic or anxiolytic-induced mood disorder             - Reduce Valium  to 5mg  TID             - Continue Methadone  110mg  daily             - Stopped Prozac  20mg  daily                         Pt has bipolar disorder, and this was added recently to no known benefit.             - Stopped Seroquel  50mg  nightly                         Replacing with Olanzapine              - Continue Olanzapine  5mg  nightly                         Pt was stable on Olanzapine  15mg  nightly at discharge from [facility], and when asked if her prefer the Olanzapine  or Seroquel , he said he had no preferrence and would be good to try the Olanzapine  again.                         QTcB , Lipid panel somewhat elevated in April. A1c nonconcerning in April             - Continue IVC for concern of self-neglect from serious confusion putting pt at non-suicidal danger of harm to self                         - Submitted 9/16, due 9/23     Psychiatry General PRNs             -- Trazodone  50 mg once nightly as needed for insomnia             --  Hydroxyzine  25 mg 3 times daily as needed for anxiety             -- Agitation protocol: Haldol , Benadryl , lorazepam      Medical Issues Being Addressed:  #none   Medical General PRNs             - Tylenol  tablets 650 mg every 6 hours as needed for pain - Maalox/Mylanta suspension 30 mL every 4 hours as needed for indigestion - Milk of Magnesia 30 mL daily as needed for constipation     Discharge Planning:              -- Social work and case management to assist with discharge planning and identification of hospital follow-up needs prior to discharge             -- Estimated LOS: 7-10 days              -- Discharge Concerns: Need to establish a safety plan; Medication compliance and effectiveness             -- Discharge Goals: Return home with outpatient referrals for mental health follow-up including medication management/psychotherapy   D. Penne Mori Psychiatry  PGY-1 08/07/2024, 11:17  AM

## 2024-08-07 NOTE — Plan of Care (Signed)
   Problem: Education: Goal: Emotional status will improve Outcome: Progressing Goal: Mental status will improve Outcome: Progressing   Problem: Activity: Goal: Sleeping patterns will improve Outcome: Progressing   Problem: Coping: Goal: Ability to verbalize frustrations and anger appropriately will improve Outcome: Progressing

## 2024-08-07 NOTE — Group Note (Signed)
 Recreation Therapy Group Note   Group Topic:Leisure Education  Group Date: 08/07/2024 Start Time: 0932 End Time: 1000 Facilitators: Kishan Wachsmuth-McCall, LRT,CTRS Location: [address] Dayroom   Group Topic: Leisure Education  Goal Area(s) Addresses:  Patient will identify positive leisure activities for use post discharge. Patient will identify at least one positive benefit of participation in leisure activities.  Patient will work effectively work with peers to keep the ball in play.  Behavioral Response:    Intervention: [employer], Music   Activity: Patients were to sit in a circle. Patients would toss a beach ball to each other keeping the ball in motion. LRT would time the group to see how long they could keep the ball moving. Patients could bounce or roll the ball but it could not come to a stop. If the ball were to stop moving, LRT would start the timer over.  Education:  Leisure Scientist, physiological, Special educational needs teacher, Teamwork, Discharge Planning  Education Outcome: Acknowledges education/In group clarification offered/Needs additional education.    Affect/Mood: N/A   Participation Level: Did not attend    Clinical Observations/Individualized Feedback:      Plan: Continue to engage patient in RT group sessions 2-3x/week.   Debbera Wolken-McCall, LRT,CTRS 08/07/2024 1:39 PM

## 2024-08-07 NOTE — Progress Notes (Signed)
   08/07/24 0000  Psych Admission Type (Psych Patients Only)  Admission Status Involuntary  Psychosocial Assessment  Patient Complaints Sleep disturbance;Restlessness;Confusion  Eye Contact Avertive;Brief  Facial Expression Sad  Affect Apprehensive  Speech Slow  Interaction Guarded  Motor Activity Pacing  Appearance/Hygiene Disheveled  Behavior Characteristics Fidgety;Pacing  Mood Preoccupied;Suspicious  Thought Process  Coherency Blocking;Disorganized  Content Paranoia  Delusions Paranoid  Perception Derealization  Hallucination None reported or observed  Judgment Poor  Confusion Mild  Danger to Self  Current suicidal ideation? Denies  Agreement Not to Harm Self Yes  Description of Agreement verbal  Danger to Others  Danger to Others None reported or observed   Pt present confused, paranoid and confused. Pt refused medication stating that he is being poisoned. Will continue to monitor.

## 2024-08-07 NOTE — Progress Notes (Signed)
   08/07/24 0815  Psych Admission Type (Psych Patients Only)  Admission Status Involuntary  Psychosocial Assessment  Patient Complaints Sleep disturbance;Restlessness;Anxiety  Eye Contact Avertive  Facial Expression Flat  Affect Apprehensive  Speech Slow  Interaction Guarded  Motor Activity Pacing  Appearance/Hygiene Disheveled  Behavior Characteristics Pacing;Fidgety  Mood Suspicious  Thought Process  Coherency Blocking;Disorganized  Content Paranoia  Delusions Paranoid  Perception Derealization  Hallucination None reported or observed  Judgment Poor  Confusion Mild  Danger to Self  Current suicidal ideation? Denies  Agreement Not to Harm Self Yes  Description of Agreement verbal  Danger to Others  Danger to Others None reported or observed

## 2024-08-07 NOTE — Group Note (Signed)
 Date:  08/07/2024 Time:  8:50 PM  Group Topic/Focus:  Wrap-Up Group:   The focus of this group is to help patients review their daily goal of treatment and discuss progress on daily workbooks.    Participation Level:  Did Not Attend  Participation Quality:  N/A  Affect:  N/A  Cognitive:  N/A  Insight: None  Engagement in Group:  N/A  Modes of Intervention:  N/A  Additional Comments:  Patient was encouraged but did not attend.   Eward Mace 08/07/2024, 8:50 PM

## 2024-08-07 NOTE — Progress Notes (Signed)
(  Sleep Hours) - 1.5 (Any PRNs that were needed, meds refused, or side effects to meds)- Refused sleep meds  (Any disturbances and when (visitation, over night)- Pt has been pacing all night, confused, tried to go other pts rooms. (Concerns raised by the patient)- Pt would like to talk to the doctor before he could take his meds. Pt also wants to be transferred to another hospital (SI/[facility])- denies

## 2024-08-07 NOTE — Group Note (Signed)
 Date:  08/07/2024 Time:  10:13 AM  Group Topic/Focus:  Goals Group:   The focus of this group is to help patients establish daily goals to achieve during treatment and discuss how the patient can incorporate goal setting into their daily lives to aide in recovery.    Participation Level:  Did Not Attend  Participation Quality:  na  Affect:  na  Cognitive:  na  Insight: None  Engagement in Group:  na  Modes of Intervention:  na  Additional Comments:  na  Nat Rummer 08/07/2024, 10:13 AM

## 2024-08-08 MED ORDER — LORAZEPAM 1 MG PO TABS
1.0000 mg | ORAL_TABLET | Freq: Two times a day (BID) | ORAL | Status: DC
  Administered 2024-08-08 – 2024-08-09 (×2): 1 mg via ORAL
  Filled 2024-08-08 (×2): qty 1

## 2024-08-08 MED ORDER — LORAZEPAM 2 MG/ML IJ SOLN: 1.0000 mg | Freq: Two times a day (BID) | INTRAMUSCULAR | Status: DC

## 2024-08-08 NOTE — Group Note (Signed)
 Recreation Therapy Group Note   Group Topic:Other  Group Date: 08/08/2024 Start Time: 1005 End Time: 1043 Facilitators: Akeem Heppler-McCall, LRT,CTRS Location: 500 Hall Dayroom   Group Topic/Focus: Self Expression   Goal Area(s) Addresses:  Patient will share their idea of self expression. Patient will be able to identify a variety of ways one can express themself. Patient will successfully share why it is beneficial to express yourself.  Behavioral Response:   Intervention: Music  Activity:  Patients were able to select songs they felt helped them express/show their emotions. Patients could sing a long or dance to the songs as they played during group. Patients were allowed to any song as long as it was clean and appropriate.   Education: Communication, Discharge Planning  Educational Outcome: Acknowledges education, Self Expression   Affect/Mood: N/A   Participation Level: Did not attend    Clinical Observations/Individualized Feedback:      Plan: Continue to engage patient in RT group sessions 2-3x/week.   Emmanuela Ghazi-McCall, LRT,CTRS 08/08/2024 11:27 AM

## 2024-08-08 NOTE — Progress Notes (Signed)
 D: Patient is alert, mildly confused, suspicious, paranoid, and cooperative. Denies SI, HI, AVH, and verbally contracts for safety. Patient denies physical symptoms/pain.    A: Scheduled medications administered per MD order. Support provided. Patient educated on safety on the unit and medications. Routine safety checks every 15 minutes. Patient stated understanding to tell nurse about any new physical symptoms. Patient understands to tell staff of any needs.     R: No adverse drug reactions noted. Patient remains safe at this time and will continue to monitor.    08/08/24 1000  Psych Admission Type (Psych Patients Only)  Admission Status Involuntary  Psychosocial Assessment  Patient Complaints Restlessness;Confusion  Eye Contact Avertive  Facial Expression Flat  Affect Apprehensive  Speech Slow  Interaction Guarded  Motor Activity Shuffling  Appearance/Hygiene Disheveled  Behavior Characteristics Restless;Fidgety  Mood Suspicious  Thought Process  Coherency Blocking;Disorganized  Content Paranoia  Delusions Paranoid  Perception Derealization  Hallucination None reported or observed  Judgment Poor  Confusion Mild  Danger to Self  Current suicidal ideation? Denies  Agreement Not to Harm Self Yes  Description of Agreement verbal  Danger to Others  Danger to Others None reported or observed

## 2024-08-08 NOTE — Progress Notes (Signed)
(  Sleep Hours) - 11hrs (Any PRNs that were needed, meds refused, or side effects to meds)- none (Any disturbances and when (visitation, over night)- none (Concerns raised by the patient)- none (SI/HI/AVH)- denies

## 2024-08-08 NOTE — Progress Notes (Signed)
 [facility] Inpatient Psychiatry Progress Note  Date: 08/08/2024 Patient: Matthew Roach MRN: [id number]   Subjective  Matthew Roach is a 38 y.o. male  with a past psychiatric history of bipolar disorder with psychotic features, opioid use disorder, anxiety disorder. Patient initially arrived to [facility] on 9/14 for chest pain, and admitted to [facility] under IVC on 9/15 for stabilization of acute on chronic psychiatric conditions. PMHx is significant for seizures.   Interval Update  08/08/24  Yesterday, pt received IM Ativan  and IM Haldol  after being moved to the 500 hall, and slept for 11 hours. Patient was seen walking this morning without acute physical complaint, and then again speaking on the phone. To let him speak on the phone, we returned later for the evaluation. On return, patient was asleep, snoring loudly after taking his oral Zyprexa  and Ativan  voluntarily. He did not wake to us  speaking his name several times, and given his recent poor sleep pattern, we chose to let him sleep. Patient was compliant with all medications this morning. Vitals signs stable.   Objective  Vitals: Blood pressure 123/79, pulse (!) 101, temperature 98.2 F (36.8 C), temperature source Oral, resp. rate (!) 24, height 6' (1.829 m), weight 83.9 kg, SpO2 97%.   Physical Examination:  Physical Exam Vitals and nursing note reviewed. Exam conducted with a chaperone present.  Constitutional:      General: He is not in acute distress.    Appearance: He is normal weight.  Pulmonary:     Effort: Pulmonary effort is normal. No respiratory distress.  Skin:    General: Skin is warm and dry.  Neurological:     Mental Status: He is alert.     Gait: Gait normal.    Review of Systems  Unable to perform ROS: Other     Musculoskeletal:  Strength & Muscle Tone: within normal limits Gait & Station: normal   Mental Status Exam  Patient was behaving appropriately on the unit in the  hallways, was speaking on the phone without concern, and was willing to take his medications this morning.  Further evaluation of his mental status not conducted because patient was asleep.   Lab Results:   No visits with results within 1 Day(s) from this visit.  Latest known visit with results is:  Admission on 08/04/2024, Discharged on 08/05/2024  Component Date Value Ref Range Status   Sodium 08/04/2024 142  135 - 145 mmol/L Final   Potassium 08/04/2024 4.1  3.5 - 5.1 mmol/L Final   Chloride 08/04/2024 102  98 - 111 mmol/L Final   CO2 08/04/2024 24  22 - 32 mmol/L Final   Glucose, Bld 08/04/2024 105 (H)  70 - 99 mg/dL Final   BUN 90/85/7974 20  6 - 20 mg/dL Final   Creatinine, Ser 08/04/2024 0.82  0.61 - 1.24 mg/dL Final   Calcium 90/85/7974 9.6  8.9 - 10.3 mg/dL Final   Total Protein 90/85/7974 7.6  6.5 - 8.1 g/dL Final   Albumin 90/85/7974 4.8  3.5 - 5.0 g/dL Final   AST 90/85/7974 24  15 - 41 U/L Final   ALT 08/04/2024 17  0 - 44 U/L Final   Alkaline Phosphatase 08/04/2024 68  38 - 126 U/L Final   Total Bilirubin 08/04/2024 0.3  0.0 - 1.2 mg/dL Final   GFR, Estimated 08/04/2024 >60  >60 mL/min Final   Anion gap 08/04/2024 16 (H)  5 - 15 Final   WBC 08/04/2024 9.5  4.0 - 10.5  K/uL Final   RBC 08/04/2024 4.99  4.22 - 5.81 MIL/uL Final   Hemoglobin 08/04/2024 12.7 (L)  13.0 - 17.0 g/dL Final   HCT 90/85/7974 41.0  39.0 - 52.0 % Final   MCV 08/04/2024 82.2  80.0 - 100.0 fL Final   MCH 08/04/2024 25.5 (L)  26.0 - 34.0 pg Final   MCHC 08/04/2024 31.0  30.0 - 36.0 g/dL Final   RDW 90/85/7974 15.0  11.5 - 15.5 % Final   Platelets 08/04/2024 298  150 - 400 K/uL Final   nRBC 08/04/2024 0.0  0.0 - 0.2 % Final   Neutrophils Relative % 08/04/2024 72  % Final   Neutro Abs 08/04/2024 6.8  1.7 - 7.7 K/uL Final   Lymphocytes Relative 08/04/2024 22  % Final   Lymphs Abs 08/04/2024 2.1  0.7 - 4.0 K/uL Final   Monocytes Relative 08/04/2024 5  % Final   Monocytes Absolute 08/04/2024 0.5  0.1  - 1.0 K/uL Final   Eosinophils Relative 08/04/2024 0  % Final   Eosinophils Absolute 08/04/2024 0.0  0.0 - 0.5 K/uL Final   Basophils Relative 08/04/2024 1  % Final   Basophils Absolute 08/04/2024 0.1  0.0 - 0.1 K/uL Final   Immature Granulocytes 08/04/2024 0  % Final   Abs Immature Granulocytes 08/04/2024 0.03  0.00 - 0.07 K/uL Final   Alcohol, Ethyl (B) 08/04/2024 <15  <15 mg/dL Final   Acetaminophen  (Tylenol ), Serum 08/04/2024 <10 (L)  10 - 30 ug/mL Final   Salicylate Lvl 08/04/2024 <7.0 (L)  7.0 - 30.0 mg/dL Final   Troponin T High Sensitivity 08/04/2024 <15  0 - 19 ng/L Final   Troponin T High Sensitivity 08/04/2024 <15  0 - 19 ng/L Final   Opiates 08/05/2024 NEGATIVE  NEGATIVE Final   Cocaine 08/05/2024 NEGATIVE  NEGATIVE Final   Benzodiazepines 08/05/2024 POSITIVE (A)  NEGATIVE Final   Amphetamines 08/05/2024 NEGATIVE  NEGATIVE Final   Tetrahydrocannabinol 08/05/2024 POSITIVE (A)  NEGATIVE Final   Barbiturates 08/05/2024 NEGATIVE  NEGATIVE Final   Methadone  Scn, Ur 08/05/2024 POSITIVE (A)  NEGATIVE Final   Fentanyl  08/05/2024 NEGATIVE  NEGATIVE Final      Assessment & Plan  Matthew Roach is a 38 y.o. male  with a past psychiatric history of bipolar disorder with psychotic features, opioid use disorder, anxiety disorder. Patient initially arrived to [facility] on 9/14 for chest pain, and admitted to [facility] under IVC on 9/15 for stabilization of acute on chronic psychiatric conditions. PMHx is significant for seizures.    Patient presentation continues to be odd.  There is underlying paranoia about where he is leading to medication noncompliance.  The confused picture is not consistent with primary psychiatric diagnoses.  May be a substance-induced delirium, odd presentation for disorganized thought, a catatonic picture, or something else.  Chart documentation has strong evidence for an underlying bipolar disorder, which may or may not be contributing to this encounter.  Plan is roughly to  continue the Valium  to help prevent seizures from withdrawal, stop offending agents, continues methadone , and get him on an antipsychotic.  IVC should stay in place as he cannot care for himself in this state.  It is unpredictable whether or not the psychotic component could lead to danger to others.   # Delirium # Sedative, hypnotic or anxiolytic-induced mood disorder             - Replaced Valium  5mg  with Ativan  1mg /1mg  for oral/IM flexibility given forced med status             -  Continue Methadone  110mg  daily             - Stopped Prozac  20mg  daily                         Pt has bipolar disorder, and this was added recently to no known benefit.             - Stopped Seroquel  50mg  nightly                         Replacing with Olanzapine              - Continue Olanzapine  5mg  nightly                         Pt was stable on Olanzapine  15mg  nightly at discharge from [facility], and when asked if her prefer the Olanzapine  or Seroquel , he said he had no preferrence and would be good to try the Olanzapine  again.                         QTcB , Lipid panel somewhat elevated in April. A1c nonconcerning in April  - Start Haldol  5mg  IM as a BACKUP if oral Olanzapine  is not taken             - Continue IVC for concern of self-neglect from serious confusion putting pt at non-suicidal danger of harm to self                         - Submitted 9/16, due 9/23    Psychiatry General PRNs             -- Trazodone  50 mg once nightly as needed for insomnia             -- Hydroxyzine  25 mg 3 times daily as needed for anxiety             -- Agitation protocol: Haldol , Benadryl , lorazepam      Medical Issues Being Addressed:  #none   Medical General PRNs             - Tylenol  tablets 650 mg every 6 hours as needed for pain - Maalox/Mylanta suspension 30 mL every 4 hours as needed for indigestion - Milk of Magnesia 30 mL daily as needed for constipation     Discharge Planning:              -- Social  work and case management to assist with discharge planning and identification of hospital follow-up needs prior to discharge             -- Estimated LOS: 7-10 days              -- Discharge Concerns: Need to establish a safety plan; Medication compliance and effectiveness             -- Discharge Goals: Return home with outpatient referrals for mental health follow-up including medication management/psychotherapy   D. Penne Mori Psychiatry  PGY-1 08/08/2024, 10:49 AM

## 2024-08-08 NOTE — BHH Group Notes (Signed)
 Adult Psychoeducational Group Note  Date:  08/08/2024 Time:  9:25 PM  Group Topic/Focus:  Wrap-Up Group:   The focus of this group is to help patients review their daily goal of treatment and discuss progress on daily workbooks.  Participation Level:  Active  Participation Quality:  Appropriate  Affect:  Appropriate  Cognitive:  Appropriate  Insight: Appropriate  Engagement in Group:  Engaged  Modes of Intervention:  Discussion and Support  Additional Comments:  Pt told that today was an okay day on the unit, the highlight of which was having a supportive visitor. On the subject of short term goals, Pt mentioned wanting to feel mental clarity again, which he last felt around a week ago. Pt rated his day a 5 out of 10.  Matthew Roach 08/08/2024, 9:25 PM

## 2024-08-08 NOTE — BHH Group Notes (Signed)
 Adult Psychoeducational Group Note  Date:  08/08/2024 Time:  4:17 PM  Group Topic/Focus:  Goals Group:   The focus of this group is to help patients establish daily goals to achieve during treatment and discuss how the patient can incorporate goal setting into their daily lives to aide in recovery. Orientation:   The focus of this group is to educate the patient on the purpose and policies of crisis stabilization and provide a format to answer questions about their admission.  The group details unit policies and expectations of patients while admitted.  Participation Level:  Did Not Attend  Participation Quality:    Affect:    Cognitive:    Insight:   Engagement in Group:    Modes of Intervention:    Additional Comments:    Nakari Bracknell O 08/08/2024, 4:17 PM

## 2024-08-08 NOTE — Progress Notes (Signed)
(  Sleep Hours) -7.25  (Any PRNs that were needed, meds refused, or side effects to meds)- Trazodone  100 mg , Vistaril  25 mg  (Any disturbances and when (visitation, over night)-n/a  (Concerns raised by the patient)- pt continues to be paranoid about his medications , pt encouraged to talk to the doctor  (SI/HI/AVH)- denies

## 2024-08-08 NOTE — Group Note (Deleted)
 Date:  08/08/2024 Time:  8:25 PM  Group Topic/Focus:  Wrap-Up Group:   The focus of this group is to help patients review their daily goal of treatment and discuss progress on daily workbooks.     Participation Level:  {BHH PARTICIPATION OZCZO:77735}  Participation Quality:  {BHH PARTICIPATION QUALITY:22265}  Affect:  {BHH AFFECT:22266}  Cognitive:  {BHH COGNITIVE:22267}  Insight: {BHH Insight2:20797}  Engagement in Group:  {BHH ENGAGEMENT IN HMNLE:77731}  Modes of Intervention:  {BHH MODES OF INTERVENTION:22269}  Additional Comments:  ***  Matthew Roach 08/08/2024, 8:25 PM

## 2024-08-08 NOTE — Plan of Care (Signed)
   Problem: Education: Goal: Knowledge of [facility] Education information/materials will improve Outcome: Progressing Goal: Emotional status will improve Outcome: Progressing Goal: Mental status will improve Outcome: Progressing Goal: Verbalization of understanding the information provided will improve Outcome: Progressing

## 2024-08-08 NOTE — Plan of Care (Signed)

## 2024-08-08 NOTE — Plan of Care (Signed)
  Problem: Education: Goal: Emotional status will improve Outcome: Progressing Goal: Mental status will improve Outcome: Progressing   Problem: Activity: Goal: Sleeping patterns will improve Outcome: Progressing   Problem: Safety: Goal: Periods of time without injury will increase Outcome: Progressing   Problem: Activity: Goal: Interest or engagement in activities will improve Outcome: Not Progressing

## 2024-08-09 MED ORDER — NICOTINE POLACRILEX 2 MG MT GUM
2.0000 mg | CHEWING_GUM | OROMUCOSAL | Status: DC | PRN
  Administered 2024-08-09 – 2024-08-18 (×12): 2 mg via ORAL
  Filled 2024-08-09 (×9): qty 1

## 2024-08-09 MED ORDER — LORAZEPAM 1 MG PO TABS
1.0000 mg | ORAL_TABLET | Freq: Two times a day (BID) | ORAL | Status: AC
  Administered 2024-08-09 – 2024-08-10 (×2): 1 mg via ORAL
  Filled 2024-08-09 (×2): qty 1

## 2024-08-09 MED ORDER — LOPERAMIDE HCL 2 MG PO CAPS
2.0000 mg | ORAL_CAPSULE | Freq: Three times a day (TID) | ORAL | Status: DC | PRN
  Filled 2024-08-09: qty 1

## 2024-08-09 MED ORDER — LORAZEPAM 2 MG/ML IJ SOLN: 1.0000 mg | Freq: Two times a day (BID) | INTRAMUSCULAR | Status: AC

## 2024-08-09 NOTE — Hospital Course (Addendum)
 Matthew Roach is a 38 y.o. male  with a past psychiatric history of bipolar disorder with psychotic features, opioid use disorder, anxiety disorder. Patient initially arrived to [facility] on 9/14 for chest pain, and admitted to [facility] under IVC on 9/15 for stabilization of acute on chronic psychiatric conditions. PMHx is significant for seizures.   Patient presentation atypical.  He presented to the emergency department because of concern for his aorta.  He was noted that he was very confused.  Credible history of at least 1 prior manic episode in April 2025 with impulsiveness, bizarre gesturing, and dangerously driving 879 mph.  Patient is on methadone  for heroin use and has been on this for over a decade.  He has been on a high dose of Valium  40 mg for many years as well.  During admission, patient was confused and paranoid of staff and medicines, refusing to take many of the medications.  Forced medications were required when patient stopped taking benzodiazepines, and he was at high risk of withdrawal seizures. He was compliant after 1 forced administration. We tapered down and discontinued the Ativan  on 9/20, but added Klonopin  at a 0.5mg  dose for ongoing anxiety. We restarted the olanzapine  and titrated it up to 5mg  in the morning and 15mg  nightly. At this dose, paranoia was still very present, and Haldol  5mg  BID was added for treating paranoia which was successful. Lithium  was reintroduced on 9/21 with a final dose of 300mg  BID. Prozac  was reintroduced to 30mg  daily, and felt safe to do so from a mania perspective because of the antipsychotics and lithium  that are on board.   Non-bizarre delusions were greatly improved, but still present. Specifically one of past [employer] [facility] of methadone  clinic being a part of a gang were still present, but judgement was intact and Matthew Roach had no ill-intent toward this person, and intended to simply change his methadone  clinic to [facility]. Per family, he was back to  baseline at time of discharge.

## 2024-08-09 NOTE — Plan of Care (Signed)

## 2024-08-09 NOTE — Progress Notes (Signed)
   08/09/24 2300  Psych Admission Type (Psych Patients Only)  Admission Status Involuntary  Psychosocial Assessment  Patient Complaints Suspiciousness  Eye Contact Fair  Facial Expression Flat  Affect Flat  Speech Slow  Interaction Cautious;Guarded  Motor Activity Slow  Appearance/Hygiene Disheveled  Behavior Characteristics Restless  Mood Suspicious  Aggressive Behavior  Effect No apparent injury  Thought Process  Coherency Blocking  Content Paranoia  Delusions Paranoid  Perception Derealization  Hallucination None reported or observed  Judgment Impaired  Confusion None  Danger to Self  Current suicidal ideation? Denies  Danger to Others  Danger to Others None reported or observed

## 2024-08-09 NOTE — Group Note (Signed)
 Recreation Therapy Group Note   Group Topic:Coping Skills  Group Date: 08/09/2024 Start Time: 1020 End Time: 1046 Facilitators: Kline Bulthuis-McCall, LRT,CTRS Location: 500 Hall Dayroom   Group Topic: Coping Skills   Goal Area(s) Addresses: Patient will define what a coping skill is. Patient will work to create a list of healthy coping skills beginning with each letter of the alphabet. Patient will successfully identify positive coping skills they can use post d/c.  Patient will acknowledge benefit(s) of using learned coping skills post d/c.  Behavioral Response: Minimal   Intervention: Group work   Activity: Coping A to Z. Patient asked to identify what a coping skill is and when they use them. Patients with Clinical research associate discussed healthy versus unhealthy coping skills. Next patients were given a blank worksheet titled Coping Skills A-Z. Partners were instructed to come up with at least one positive coping skill per letter of the alphabet. Patients were given 15 minutes to brainstorm before ideas were presented to the large group. Patients and LRT debriefed on the importance of coping skill selection based on situation and back-up plans when a skill tried is not effective. At the end of group, patients were given an handout of alphabetized strategies to keep for future reference.   Education: Pharmacologist, Scientist, physiological, Discharge Planning.    Education Outcome: Acknowledges education/Verbalizes understanding/In group clarification offered/Additional education needed   Affect/Mood: Depressed   Participation Level: Minimal   Participation Quality: Independent   Behavior: Cooperative   Speech/Thought Process: Relevant   Insight: Fair   Judgement: Fair    Modes of Intervention: Worksheet   Patient Response to Interventions:  Receptive   Education Outcome:  In group clarification offered    Clinical Observations/Individualized Feedback: Pt was quiet and appeared  depressed during group. Pt identified coping skills as exercise, going on walks, meditation and yoga.     Plan: Continue to engage patient in RT group sessions 2-3x/week.   Matthew Roach, LRT,CTRS 08/09/2024 1:24 PM

## 2024-08-09 NOTE — Group Note (Signed)
 Date:  08/09/2024 Time:  8:48 PM  Group Topic/Focus:  Wrap-Up Group:   The focus of this group is to help patients review their daily goal of treatment and discuss progress on daily workbooks.    Participation Level:  Active  Participation Quality:  Appropriate  Affect:  Appropriate  Cognitive:  Appropriate  Insight: Appropriate  Engagement in Group:  Engaged  Modes of Intervention:  Education and Exploration  Additional Comments:  Patient attended and participated in group tonight. He reports that today he learn to have patience and to follow the doctor's instructions.  Masiya Claassen Dacosta 08/09/2024, 8:48 PM

## 2024-08-09 NOTE — Progress Notes (Signed)
 [facility] Progress Note  Date: 08/09/2024 Patient: Matthew Roach MRN: [id number]   Subjective  Matthew Roach is a 38 y.o. male  with a past psychiatric history of bipolar disorder with psychotic features, opioid use disorder, anxiety disorder. Patient initially arrived to [facility] on 9/14 for chest pain, and admitted to [facility] under IVC on 9/15 for stabilization of acute on chronic psychiatric conditions. PMHx is significant for seizures.   Interval Update  08/09/24  Overnight, vital signs been stable, required as needed trazodone  and hydroxyzine  to help him sleep, has been medication compliant.  Nursing notes report that he is still feeling paranoid about the [facility] setting.  Interview: Today patient still presents as somewhat confused, however improved from yesterday.  Paranoia is lessened and patient explains his concerns for why he feels confused.  Is worried that he may have early dementia and that he would like to be off of the benzos.  Says that he stopped taking the olanzapine  when as prescribed in the outpatient setting because he saw it as a sleep medication which she did not need.  We provided psychoeducation on his medications and he had no further questions.   No planned medication changes 9/19, will continue to allow Olanzapine  to set in at its current dose.  Objective  Vitals: Blood pressure 128/79, pulse 89, temperature (!) 97.4 F (36.3 C), temperature source Oral, resp. rate (!) 24, height 6' (1.829 m), weight 83.9 kg, SpO2 99%.   Physical Examination:  Physical Exam Vitals and nursing note reviewed. Exam conducted with a chaperone present.  Constitutional:      General: He is not in acute distress.    Appearance: He is normal weight.  Pulmonary:     Effort: Pulmonary effort is normal. No respiratory distress.  Skin:    General: Skin is warm and dry.  Neurological:     Mental Status: He is alert.     Gait: Gait normal.     Review of Systems  Unable to perform ROS: Other     Musculoskeletal:  Strength & Muscle Tone: within normal limits Gait & Station: normal   Mental Status Exam  Patient was behaving appropriately on the unit in the hallways, was speaking on the phone without concern, and was willing to take his medications this morning.  Further evaluation of his mental status not conducted because patient was asleep.   Lab Results:   No visits with results within 1 Day(s) from this visit.  Latest known visit with results is:  Admission on 08/04/2024, Discharged on 08/05/2024  Component Date Value Ref Range Status   Sodium 08/04/2024 142  135 - 145 mmol/L Final   Potassium 08/04/2024 4.1  3.5 - 5.1 mmol/L Final   Chloride 08/04/2024 102  98 - 111 mmol/L Final   CO2 08/04/2024 24  22 - 32 mmol/L Final   Glucose, Bld 08/04/2024 105 (H)  70 - 99 mg/dL Final   BUN 90/85/7974 20  6 - 20 mg/dL Final   Creatinine, Ser 08/04/2024 0.82  0.61 - 1.24 mg/dL Final   Calcium 90/85/7974 9.6  8.9 - 10.3 mg/dL Final   Total Protein 90/85/7974 7.6  6.5 - 8.1 g/dL Final   Albumin 90/85/7974 4.8  3.5 - 5.0 g/dL Final   AST 90/85/7974 24  15 - 41 U/L Final   ALT 08/04/2024 17  0 - 44 U/L Final   Alkaline Phosphatase 08/04/2024 68  38 - 126 U/L Final   Total Bilirubin  08/04/2024 0.3  0.0 - 1.2 mg/dL Final   GFR, Estimated 08/04/2024 >60  >60 mL/min Final   Anion gap 08/04/2024 16 (H)  5 - 15 Final   WBC 08/04/2024 9.5  4.0 - 10.5 K/uL Final   RBC 08/04/2024 4.99  4.22 - 5.81 MIL/uL Final   Hemoglobin 08/04/2024 12.7 (L)  13.0 - 17.0 g/dL Final   HCT 90/85/7974 41.0  39.0 - 52.0 % Final   MCV 08/04/2024 82.2  80.0 - 100.0 fL Final   MCH 08/04/2024 25.5 (L)  26.0 - 34.0 pg Final   MCHC 08/04/2024 31.0  30.0 - 36.0 g/dL Final   RDW 90/85/7974 15.0  11.5 - 15.5 % Final   Platelets 08/04/2024 298  150 - 400 K/uL Final   nRBC 08/04/2024 0.0  0.0 - 0.2 % Final   Neutrophils Relative % 08/04/2024 72  % Final    Neutro Abs 08/04/2024 6.8  1.7 - 7.7 K/uL Final   Lymphocytes Relative 08/04/2024 22  % Final   Lymphs Abs 08/04/2024 2.1  0.7 - 4.0 K/uL Final   Monocytes Relative 08/04/2024 5  % Final   Monocytes Absolute 08/04/2024 0.5  0.1 - 1.0 K/uL Final   Eosinophils Relative 08/04/2024 0  % Final   Eosinophils Absolute 08/04/2024 0.0  0.0 - 0.5 K/uL Final   Basophils Relative 08/04/2024 1  % Final   Basophils Absolute 08/04/2024 0.1  0.0 - 0.1 K/uL Final   Immature Granulocytes 08/04/2024 0  % Final   Abs Immature Granulocytes 08/04/2024 0.03  0.00 - 0.07 K/uL Final   Alcohol, Ethyl (B) 08/04/2024 <15  <15 mg/dL Final   Acetaminophen  (Tylenol ), Serum 08/04/2024 <10 (L)  10 - 30 ug/mL Final   Salicylate Lvl 08/04/2024 <7.0 (L)  7.0 - 30.0 mg/dL Final   Troponin T High Sensitivity 08/04/2024 <15  0 - 19 ng/L Final   Troponin T High Sensitivity 08/04/2024 <15  0 - 19 ng/L Final   Opiates 08/05/2024 NEGATIVE  NEGATIVE Final   Cocaine 08/05/2024 NEGATIVE  NEGATIVE Final   Benzodiazepines 08/05/2024 POSITIVE (A)  NEGATIVE Final   Amphetamines 08/05/2024 NEGATIVE  NEGATIVE Final   Tetrahydrocannabinol 08/05/2024 POSITIVE (A)  NEGATIVE Final   Barbiturates 08/05/2024 NEGATIVE  NEGATIVE Final   Methadone  Scn, Ur 08/05/2024 POSITIVE (A)  NEGATIVE Final   Fentanyl  08/05/2024 NEGATIVE  NEGATIVE Final      Assessment & Plan  Matthew Roach is a 38 y.o. male  with a past psychiatric history of bipolar disorder with psychotic features, opioid use disorder, anxiety disorder. Patient initially arrived to [facility] on 9/14 for chest pain, and admitted to [facility] under IVC on 9/15 for stabilization of acute on chronic psychiatric conditions. PMHx is significant for seizures.    Patient presentation continues to be odd.  There is underlying paranoia about where he is leading to medication noncompliance.  The confused picture is not consistent with primary psychiatric diagnoses.  May be a substance-induced delirium,  odd presentation for disorganized thought, a catatonic picture, or something else.  Chart documentation has strong evidence for an underlying bipolar disorder, which may or may not be contributing to this encounter.  Plan is roughly to continue the Valium  to help prevent seizures from withdrawal, stop offending agents, continues methadone , and get him on an antipsychotic.  IVC should stay in place as he cannot care for himself in this state.  It is unpredictable whether or not the psychotic component could lead to danger to others.   #  Delirium # Sedative, hypnotic or anxiolytic-induced mood disorder             - Replaced Valium  5mg  with Ativan  1mg /1mg  for oral/IM flexibility given forced med status. Final dose 9/20 morning.              - Continue Methadone  110mg  daily             - Stopped Prozac  20mg  daily                         Pt has bipolar disorder, and this was added recently to no known benefit.             - Stopped Seroquel  50mg  nightly                         Replacing with Olanzapine              - Continue Olanzapine  5mg  nightly                         Pt was stable on Olanzapine  15mg  nightly at discharge from [facility], and when asked if her prefer the Olanzapine  or Seroquel , he said he had no preferrence and would be good to try the Olanzapine  again.                         QTcB , Lipid panel somewhat elevated in April. A1c nonconcerning in April  - Continue Haldol  5mg  IM as a BACKUP if oral Olanzapine  is not taken             - Continue IVC for concern of self-neglect from serious confusion putting pt at non-suicidal danger of harm to self                         - Submitted 9/16, due 9/23    Psychiatry General PRNs             -- Trazodone  50 mg once nightly as needed for insomnia             -- Hydroxyzine  25 mg 3 times daily as needed for anxiety             -- Agitation protocol: Haldol , Benadryl , lorazepam     Medical Issues Being Addressed:  #none   Medical  General PRNs             - Tylenol  tablets 650 mg every 6 hours as needed for pain - Maalox/Mylanta suspension 30 mL every 4 hours as needed for indigestion - Milk of Magnesia 30 mL daily as needed for constipation     Discharge Planning:              -- Social work and case management to assist with discharge planning and identification of [facility] follow-up needs prior to discharge             -- Estimated LOS: 7-10 days              -- Discharge Concerns: Need to establish a safety plan; Medication compliance and effectiveness             -- Discharge Goals: Return home with outpatient referrals for mental health follow-up including medication management/psychotherapy   D. Penne Mori Psychiatry  PGY-1 08/09/2024, 10:43 AM

## 2024-08-09 NOTE — [facility] Group Notes (Signed)
 Spirituality Group   Description: Participant directed exploration of values, beliefs and meaning   Following a brief framework of chaplain's role and ground rules of group behavior, participants are invited to share concerns or questions that engage spiritual life. Emphasis placed on common themes and shared experiences and ways to make meaning and clarify living into one's values.   Theory/Process/Goal: Utilize the theoretical framework of group therapy established by Celena Kite, Relational Cultural Theory and Rogerian approaches to facilitate relational empathy and use of the "here and now" to foster reflection, self-awareness, and sharing.   Observations: Matthew Roach was present for the full group time but often too drowsy to engage. He was pleasant and made the effort to participate to the extent that he could.  Anderson Middlebrooks L. Delores HERO.Div

## 2024-08-09 NOTE — Progress Notes (Signed)
   08/09/24 0752  Psych Admission Type (Psych Patients Only)  Admission Status Involuntary  Psychosocial Assessment  Patient Complaints Suspiciousness  Eye Contact Fair  Facial Expression Flat  Affect Flat  Speech Slow  Interaction Cautious;Guarded  Motor Activity Slow  Appearance/Hygiene Disheveled  Behavior Characteristics Restless  Mood Suspicious  Thought Process  Coherency Blocking  Content Paranoia  Delusions Paranoid  Perception Derealization  Hallucination None reported or observed  Judgment Impaired  Confusion None  Danger to Self  Current suicidal ideation? Denies  Agreement Not to Harm Self Yes  Description of Agreement verbal  Danger to Others  Danger to Others None reported or observed

## 2024-08-09 NOTE — [facility] Group Notes (Signed)
 Adult Psychoeducational Group Note  Date:  08/09/2024 Time:  7:45 PM  Group Topic/Focus:  Goals Group:   The focus of this group is to help patients establish daily goals to achieve during treatment and discuss how the patient can incorporate goal setting into their daily lives to aide in recovery. Orientation:   The focus of this group is to educate the patient on the purpose and policies of crisis stabilization and provide a format to answer questions about their admission.  The group details unit policies and expectations of patients while admitted.  Participation Level:  Active  Participation Quality:  Appropriate  Affect:  Appropriate  Cognitive:  Appropriate  Insight: Appropriate  Engagement in Group:  Engaged  Modes of Intervention:  Discussion  Additional Comments:  Pt attended the goals group and remained appropriate and engaged throughout the duration of the group.  Anquan Azzarello O 08/09/2024, 7:45 PM

## 2024-08-10 MED ORDER — OLANZAPINE 10 MG PO TABS
10.0000 mg | ORAL_TABLET | Freq: Every day | ORAL | Status: AC
  Administered 2024-08-10: 10 mg via ORAL
  Filled 2024-08-10: qty 1

## 2024-08-10 MED ORDER — HALOPERIDOL LACTATE 5 MG/ML IJ SOLN
5.0000 mg | Freq: Every day | INTRAMUSCULAR | Status: AC
Start: 2024-08-10 — End: 2024-08-10

## 2024-08-10 MED ORDER — OLANZAPINE 7.5 MG PO TABS
15.0000 mg | ORAL_TABLET | Freq: Every day | ORAL | Status: DC
  Administered 2024-08-11 – 2024-08-19 (×9): 15 mg via ORAL
  Filled 2024-08-10 (×9): qty 2

## 2024-08-10 MED ORDER — LITHIUM CARBONATE ER 300 MG PO TBCR
300.0000 mg | EXTENDED_RELEASE_TABLET | Freq: Two times a day (BID) | ORAL | Status: DC
  Administered 2024-08-11 – 2024-08-20 (×19): 300 mg via ORAL
  Filled 2024-08-10 (×18): qty 1

## 2024-08-10 NOTE — Progress Notes (Signed)
 D. Pt has been calm and cooperative, visible in the milieu, observed interacting appropriately with peers and staff. Per pt's self inventory, pt rated his depression,hopelessness and anxiety a 3/1/5, respectively. Pt reported sleeping well last night, described his appetite as 'good', his concentration as 'poor', and energy level as 'normal'. Pt currently denies SI/HI and AVH and doesn't appear to be responding to internal stimuli.  A. Labs and vitals monitored. Pt given and educated on medications. Pt supported emotionally and encouraged to express concerns and ask questions.   R. Pt remains safe with 15 minute checks. Will continue POC.

## 2024-08-10 NOTE — Group Note (Signed)
 Date:  08/10/2024 Time:  8:45 PM  Group Topic/Focus:  Wrap-Up Group:   The focus of this group is to help patients review their daily goal of treatment and discuss progress on daily workbooks.    Participation Level:  Did Not Attend   Tionne Dayhoff Dacosta 08/10/2024, 8:45 PM

## 2024-08-10 NOTE — Progress Notes (Signed)
(  Sleep Hours) -5.5 (Any PRNs that were needed, meds refused, or side effects to meds)- Trazodone  and nicotine  gum (Any disturbances and when (visitation, over night)-none (Concerns raised by the patient)- none (SI/HI/AVH)-denied

## 2024-08-10 NOTE — Group Note (Signed)
 LCSW Group Therapy Note  Group Date: 08/10/2024 Start Time: 1045 End Time: 1145   Type of Therapy and Topic:  Group Therapy: Using I Statements  Participation Level:  Active  Description of Group:  Patients were asked to provide details of some interpersonal conflicts they have experienced. Patients were then educated about "I" statements, communication which focuses on feelings or views of the speaker rather than what the other person is doing. T group members were asked to reflect on past conflicts and to provide specific examples for utilizing "I" statements.  Therapeutic Goals:  Patients will verbalize understanding of ineffective communication and effective communication. Patients will be able to empathize with whom they are having conflict. Patients will practice effective communication in the form of "I" statements.    Summary of Patient Progress:  Matthew [facility] shared how he is dedicated to living a healthier lifestyle and pivot from procrastination while remaining positive, going to the doctor and taking his meds. The patient was present and active throughout the session and proved open to feedback from CSW and peers. Patient demonstrated good insight into the subject matter, was respectful of peers, and was present throughout the entire session.  Therapeutic Modalities:   Cognitive Behavioral Therapy Solution-Focused Therapy  [name]'s I statement to promote emotional wellness was I will stay away from negativity and keep positive.   [name] Lever, [place] 08/10/2024  5:43 PM

## 2024-08-10 NOTE — Progress Notes (Signed)
 [facility] Inpatient Psychiatry Progress Note  Date: 08/10/2024 Patient: Matthew Roach MRN: [id number]   Subjective  Matthew Roach is a 38 y.o. male  with a past psychiatric history of bipolar disorder with psychotic features, opioid use disorder, anxiety disorder. Patient initially arrived to [facility] on 9/14 for chest pain, and admitted to [facility] under IVC on 9/15 for stabilization of acute on chronic psychiatric conditions. PMHx is significant for seizures.   Interval Update  08/10/24  Overnight, vital signs been stable, required PRN trazodone  to help him sleep, has been medication compliant. No significant events overnight.  Interview: Today patient still presents as somewhat confused, however again improved from yesterday.  Paranoia is lessened and patient explains his concerns for why he feels confused. He does not remember details from past conversations, and again brought up that he is worried that he may have early dementia. Is worried about coming completely off the benzos because he has been on them for 20 years. We provided psychoeducation on his medications and he had no further questions. Discussed below planned medication changes with patient, and he had no further questions.  Medication changes 9/20  - increasing Olanzapine  to 10mg  tonight (total 15mg  today) and then consolidating to 15mg  nightly starting 9/21  - Will start Lithium  300mg  BID starting 9/21.  Objective  Vitals: Blood pressure 119/74, pulse 84, temperature (!) 97.4 F (36.3 C), temperature source Oral, resp. rate (!) 24, height 6' (1.829 m), weight 83.9 kg, SpO2 99%.   Physical Examination:  Physical Exam Vitals and nursing note reviewed. Exam conducted with a chaperone present.  Constitutional:      General: He is not in acute distress.    Appearance: He is normal weight.  Pulmonary:     Effort: Pulmonary effort is normal. No respiratory distress.  Skin:    General: Skin is warm and  dry.  Neurological:     Mental Status: He is alert.     Gait: Gait normal.    Review of Systems  Constitutional: Negative.  Negative for fever.  Respiratory: Negative.  Negative for shortness of breath.   Cardiovascular: Negative.   Gastrointestinal: Negative.  Negative for constipation and diarrhea.     Musculoskeletal:  Strength & Muscle Tone: within normal limits Gait & Station: normal   Mental Status Exam  Apperance: Appropriate for environment, Sitting upright, Standing, and PACING Behavior: Calm Speech: Normal Rate, Articulate, Normal Volume, and Responsive Attitude: Cooperative and Friendly Mood: confused, denies anxiety Affect: ANXIOUS, RESTRICTED, and Mood INCONGRUENT Perception: Not responding to internal stimuli Thought Content: within normal limits Thought Form: Goal Directed, Organized, Linear, and Logical Cognition: Alert & Oriented to person, place, and time, Recent and Remote memory grossly intact by recounting personal history, and Immediate memory grossly intact by interview Judgment: Fair Insight: POOR   Lab Results:   No visits with results within 1 Day(s) from this visit.  Latest known visit with results is:  Admission on 08/04/2024, Discharged on 08/05/2024  Component Date Value Ref Range Status   Sodium 08/04/2024 142  135 - 145 mmol/L Final   Potassium 08/04/2024 4.1  3.5 - 5.1 mmol/L Final   Chloride 08/04/2024 102  98 - 111 mmol/L Final   CO2 08/04/2024 24  22 - 32 mmol/L Final   Glucose, Bld 08/04/2024 105 (H)  70 - 99 mg/dL Final   BUN 90/85/7974 20  6 - 20 mg/dL Final   Creatinine, Ser 08/04/2024 0.82  0.61 - 1.24 mg/dL Final  Calcium 08/04/2024 9.6  8.9 - 10.3 mg/dL Final   Total Protein 90/85/7974 7.6  6.5 - 8.1 g/dL Final   Albumin 90/85/7974 4.8  3.5 - 5.0 g/dL Final   AST 90/85/7974 24  15 - 41 U/L Final   ALT 08/04/2024 17  0 - 44 U/L Final   Alkaline Phosphatase 08/04/2024 68  38 - 126 U/L Final   Total Bilirubin 08/04/2024  0.3  0.0 - 1.2 mg/dL Final   GFR, Estimated 08/04/2024 >60  >60 mL/min Final   Anion gap 08/04/2024 16 (H)  5 - 15 Final   WBC 08/04/2024 9.5  4.0 - 10.5 K/uL Final   RBC 08/04/2024 4.99  4.22 - 5.81 MIL/uL Final   Hemoglobin 08/04/2024 12.7 (L)  13.0 - 17.0 g/dL Final   HCT 90/85/7974 41.0  39.0 - 52.0 % Final   MCV 08/04/2024 82.2  80.0 - 100.0 fL Final   MCH 08/04/2024 25.5 (L)  26.0 - 34.0 pg Final   MCHC 08/04/2024 31.0  30.0 - 36.0 g/dL Final   RDW 90/85/7974 15.0  11.5 - 15.5 % Final   Platelets 08/04/2024 298  150 - 400 K/uL Final   nRBC 08/04/2024 0.0  0.0 - 0.2 % Final   Neutrophils Relative % 08/04/2024 72  % Final   Neutro Abs 08/04/2024 6.8  1.7 - 7.7 K/uL Final   Lymphocytes Relative 08/04/2024 22  % Final   Lymphs Abs 08/04/2024 2.1  0.7 - 4.0 K/uL Final   Monocytes Relative 08/04/2024 5  % Final   Monocytes Absolute 08/04/2024 0.5  0.1 - 1.0 K/uL Final   Eosinophils Relative 08/04/2024 0  % Final   Eosinophils Absolute 08/04/2024 0.0  0.0 - 0.5 K/uL Final   Basophils Relative 08/04/2024 1  % Final   Basophils Absolute 08/04/2024 0.1  0.0 - 0.1 K/uL Final   Immature Granulocytes 08/04/2024 0  % Final   Abs Immature Granulocytes 08/04/2024 0.03  0.00 - 0.07 K/uL Final   Alcohol, Ethyl (B) 08/04/2024 <15  <15 mg/dL Final   Acetaminophen  (Tylenol ), Serum 08/04/2024 <10 (L)  10 - 30 ug/mL Final   Salicylate Lvl 08/04/2024 <7.0 (L)  7.0 - 30.0 mg/dL Final   Troponin T High Sensitivity 08/04/2024 <15  0 - 19 ng/L Final   Troponin T High Sensitivity 08/04/2024 <15  0 - 19 ng/L Final   Opiates 08/05/2024 NEGATIVE  NEGATIVE Final   Cocaine 08/05/2024 NEGATIVE  NEGATIVE Final   Benzodiazepines 08/05/2024 POSITIVE (A)  NEGATIVE Final   Amphetamines 08/05/2024 NEGATIVE  NEGATIVE Final   Tetrahydrocannabinol 08/05/2024 POSITIVE (A)  NEGATIVE Final   Barbiturates 08/05/2024 NEGATIVE  NEGATIVE Final   Methadone  Scn, Ur 08/05/2024 POSITIVE (A)  NEGATIVE Final   Fentanyl  08/05/2024  NEGATIVE  NEGATIVE Final      Assessment & Plan  Matthew Roach is a 38 y.o. male  with a past psychiatric history of bipolar disorder with psychotic features, opioid use disorder, anxiety disorder. Patient initially arrived to [facility] on 9/14 for chest pain, and admitted to [facility] under IVC on 9/15 for stabilization of acute on chronic psychiatric conditions. PMHx is significant for seizures.    Patient presentation continues to be odd.  There is underlying paranoia about where he is leading to medication noncompliance.  The confused picture is not consistent with primary psychiatric diagnoses.  May be a substance-induced delirium, odd presentation for disorganized thought, a catatonic picture, or something else.  Chart documentation has strong evidence for an underlying  bipolar disorder, which may or may not be contributing to this encounter.  Plan is roughly to continue the Valium  to help prevent seizures from withdrawal, stop offending agents, continues methadone , and get him on an antipsychotic.  IVC should stay in place as he cannot care for himself in this state.  It is unpredictable whether or not the psychotic component could lead to danger to others.   # Delirium # Sedative, hypnotic or anxiolytic-induced mood disorder - Increase Olanzapine  to 10mg  for 9/20 at bedtime, then consolidate morning and evening dose to 15mg  nightly starting 9/21                         Pt was stable on Olanzapine  15mg  nightly at discharge from [facility], and when asked if her prefer the Olanzapine  or Seroquel , he said he had no preferrence and would be good to try the Olanzapine  again.                         QTcB , Lipid panel somewhat elevated in April. A1c nonconcerning in April  - Continue Haldol  5mg  IM as a BACKUP if oral Olanzapine  is not taken - Continue Methadone  110mg  daily             - Continue IVC for concern of self-neglect from serious confusion putting pt at non-suicidal danger of harm to self                          - Submitted 9/16, due 9/23   - Finished Ativan  taper on 9/20.    - Stopped Prozac  20mg  daily                         Pt has bipolar disorder, and this was added recently to no known benefit.             - Stopped Seroquel  50mg  nightly                         Replaced with Olanzapine     Psychiatry General PRNs             -- Trazodone  50 mg once nightly as needed for insomnia             -- Hydroxyzine  25 mg 3 times daily as needed for anxiety             -- Agitation protocol: Haldol , Benadryl , lorazepam     Medical Issues Being Addressed:  #none   Medical General PRNs             - Tylenol  tablets 650 mg every 6 hours as needed for pain - Maalox/Mylanta suspension 30 mL every 4 hours as needed for indigestion - Milk of Magnesia 30 mL daily as needed for constipation     Discharge Planning:              -- Social work and case management to assist with discharge planning and identification of hospital follow-up needs prior to discharge             -- Estimated LOS: 7-10 days              -- Discharge Concerns: Need to establish a safety plan; Medication compliance and effectiveness             -- Discharge  Goals: Return home with outpatient referrals for mental health follow-up including medication management/psychotherapy   D. Penne Mori Psychiatry  PGY-1 08/10/2024, 10:51 AM

## 2024-08-10 NOTE — [facility] Group Notes (Signed)
 Adult Psychoeducational Group Note  Date:  08/10/2024 Time:  7:52 PM  Group Topic/Focus:  Goals Group:   The focus of this group is to help patients establish daily goals to achieve during treatment and discuss how the patient can incorporate goal setting into their daily lives to aide in recovery. Orientation:   The focus of this group is to educate the patient on the purpose and policies of crisis stabilization and provide a format to answer questions about their admission.  The group details unit policies and expectations of patients while admitted.  Participation Level:  Active  Participation Quality:  Appropriate  Affect:  Appropriate  Cognitive:  Appropriate  Insight: Appropriate  Engagement in Group:  Engaged  Modes of Intervention:  Discussion  Additional Comments:  Pt attended the goals group and remained appropriate and engaged throughout the duration of the group.   Immaculate Crutcher O 08/10/2024, 7:52 PM

## 2024-08-10 NOTE — Plan of Care (Signed)
   Problem: Education: Goal: Emotional status will improve Outcome: Progressing Goal: Mental status will improve Outcome: Progressing

## 2024-08-11 MED ORDER — INFLUENZA VIRUS VACC SPLIT PF (FLUZONE) 0.5 ML IM SUSY
0.5000 mL | PREFILLED_SYRINGE | INTRAMUSCULAR | Status: DC
  Filled 2024-08-11: qty 0.5

## 2024-08-11 NOTE — Progress Notes (Signed)
 D. Pt has been appropriate during interactions, reported sleeping well last night, described his appetite as 'fair', energy level as 'low', and concentration as 'poor'. Per pts' self inventory, pt rated his depression,hopelessness and anxiety a 3/0/6, respectively. Pt still voices concern at his memory. Pt currently denies SI/HI and AVH and doesn't appear to be responding to internal stimuli. A. Labs and vitals monitored. Pt given and educated on medications. Pt supported emotionally and encouraged to express concerns and ask questions.   R. Pt remains safe with 15 minute checks. Will continue POC.

## 2024-08-11 NOTE — Group Note (Signed)
 Date:  08/11/2024 Time:  8:55 PM  Group Topic/Focus:  Wrap-Up Group:   The focus of this group is to help patients review their daily goal of treatment and discuss progress on daily workbooks.    Participation Level:  Active  Participation Quality:  Appropriate  Affect:  Appropriate  Cognitive:  Appropriate  Insight: Appropriate  Engagement in Group:  Engaged  Modes of Intervention:  Education and Exploration  Additional Comments:  Patient attended and participated in group tonight. He reports he likes that if he has a passion to do something he will certainly get it done.  He would like to work on being less self conscious.   Jacquelina Hewins Dacosta 08/11/2024, 8:55 PM

## 2024-08-11 NOTE — Progress Notes (Signed)
(  Sleep Hours) -3.75 (Any PRNs that were needed, meds refused, or side effects to meds)-Atarax   (Any disturbances and when (visitation, over night)-none (Concerns raised by the patient)- none (SI/HI/AVH)-Stated AH were improving

## 2024-08-11 NOTE — Plan of Care (Signed)
   Problem: Education: Goal: Emotional status will improve Outcome: Progressing Goal: Mental status will improve Outcome: Progressing   Problem: Activity: Goal: Interest or engagement in activities will improve Outcome: Progressing

## 2024-08-11 NOTE — Group Note (Unsigned)
 Date:  08/11/2024 Time:  8:51 PM  Group Topic/Focus:  Wrap-Up Group:   The focus of this group is to help patients review their daily goal of treatment and discuss progress on daily workbooks.     Participation Level:  {[facility] PARTICIPATION OZCZO:77735}  Participation Quality:  {[facility] PARTICIPATION QUALITY:22265}  Affect:  {[facility] AFFECT:22266}  Cognitive:  {[facility] COGNITIVE:22267}  Insight: {BHH Insight2:20797}  Engagement in Group:  {[facility] ENGAGEMENT IN HMNLE:77731}  Modes of Intervention:  {B[facility] MODES OF INTERVENTION:22269}  Additional Comments:  ***  Stephane Niemann A Prisha Hiley 08/11/2024, 8:51 PM

## 2024-08-11 NOTE — Group Note (Unsigned)
 Date:  08/11/2024 Time:  8:30 PM  Group Topic/Focus:  Wrap-Up Group:   The focus of this group is to help patients review their daily goal of treatment and discuss progress on daily workbooks.     Participation Level:  {[facility] PARTICIPATION OZCZO:77735}  Participation Quality:  {[facility] PARTICIPATION QUALITY:22265}  Affect:  {[facility] AFFECT:22266}  Cognitive:  {[facility] COGNITIVE:22267}  Insight: {BHH Insight2:20797}  Engagement in Group:  {[facility] ENGAGEMENT IN HMNLE:77731}  Modes of Intervention:  {B[facility] MODES OF INTERVENTION:22269}  Additional Comments:  ***  Gwenn Nobie Brooklyn 08/11/2024, 8:30 PM

## 2024-08-11 NOTE — Progress Notes (Signed)
 [facility] Inpatient Psychiatry Progress Note  Date: 08/11/2024 Patient: Matthew Roach MRN: [id number]   Subjective  Matthew Roach is a 38 y.o. male  with a past psychiatric history of bipolar disorder with psychotic features, opioid use disorder, anxiety disorder. Patient initially arrived to [facility] on 9/14 for chest pain, and admitted to [facility] under IVC on 9/15 for stabilization of acute on chronic psychiatric conditions. PMHx is significant for seizures.   Psychiatric History: Previous psychiatric diagnoses: Polar disorder with psychotic features, opioid use disorder, anxiety disorder, seizures Prior psychiatric treatment: Lithium , Prozac , Remeron, Suboxone, diazepam , trazodone , olanzapine , methadone  Psychiatric medication compliance history: Noncompliant  Current psychiatric treatment: Diazepam , lithium , trazodone , olanzapine , methadone  Current psychiatrist: PCP Dr. Kennyth, psychiatric management post [facility] hospitalization has been handled by PCP  Current therapist: Denies  Previous hospitalizations: 02/2024: [facility] History of suicide attempts: None History of self harm: None  Interval Update  08/11/24  No significant events overnight. Vital Signs are Stable. MAR was reviewed and patient was compliant with medications yesterday. They received no PRN medications yesterday. Case was discussed in the multidisciplinary team.   Psychiatric Team made the following recommendations yesterday: -Consolidate morning and evening dose of olanzapine  to 15 mg nightly -Start lithium  300 mg twice daily  Patient reports that he slept well overnight, though nursing documentation reflects only 3.75 hours of sleep. Patient suggests he may appear awake due to sleeping with his eyes open. Appetite is reported as good. He denies suicidal ideation, homicidal ideation, or hallucinations. He denies paranoia or ideas of reference, though he does endorse ruminative thoughts at  night. No concerns expressed with his current medication regimen. He remains preoccupied with the possibility of developing delirium due to his long-term benzodiazepine use.  Discussed plan with patient to which they were agreeable. Pt questions were answered.   Objective  Vitals: Blood pressure 126/78, pulse (!) 113, temperature (!) 97.4 F (36.3 C), temperature source Oral, resp. rate (!) 24, height 6' (1.829 m), weight 83.9 kg, SpO2 98%.   Physical Examination:  Physical Exam Constitutional:      General: He is not in acute distress.    Appearance: He is not ill-appearing.  HENT:     Head: Normocephalic and atraumatic.  Neurological:     General: No focal deficit present.     Mental Status: He is oriented to person, place, and time.    Review of Systems  Gastrointestinal:  Negative for abdominal pain, constipation, diarrhea, nausea and vomiting.  Neurological:  Negative for dizziness and headaches.  Psychiatric/Behavioral:  Negative for suicidal ideas.     Musculoskeletal:  Strength & Muscle Tone: within normal limits Gait & Station: normal   Mental Status Exam  Apperance: Appropriate for environment and PACING Behavior: Calm Speech: Normal Rate, Articulate, Normal Volume, and Responsive Attitude: Cooperative and Friendly Mood: I feel better, still slightly confused Affect: Euthymic, Normal Range, and Mood Congruent Perception: Not responding to internal stimuli Thought Content: within normal limits Thought Form: Goal Directed, Organized, and Linear Cognition: Alert & Oriented to person, place, and time, Recent and Remote memory grossly intact by recounting personal history, and Immediate memory grossly intact by interview Judgment: Fair Insight: Fair  Reliability: Reliable  Key Points: Denies SI, HI, A&VH   Lab Results: No visits with results within 1 Day(s) from this visit.  Latest known visit with results is:  Admission on 08/04/2024, Discharged on  08/05/2024  Component Date Value Ref Range Status   Sodium 08/04/2024 142  135 -  145 mmol/L Final   Potassium 08/04/2024 4.1  3.5 - 5.1 mmol/L Final   Chloride 08/04/2024 102  98 - 111 mmol/L Final   CO2 08/04/2024 24  22 - 32 mmol/L Final   Glucose, Bld 08/04/2024 105 (H)  70 - 99 mg/dL Final   BUN 90/85/7974 20  6 - 20 mg/dL Final   Creatinine, Ser 08/04/2024 0.82  0.61 - 1.24 mg/dL Final   Calcium 90/85/7974 9.6  8.9 - 10.3 mg/dL Final   Total Protein 90/85/7974 7.6  6.5 - 8.1 g/dL Final   Albumin 90/85/7974 4.8  3.5 - 5.0 g/dL Final   AST 90/85/7974 24  15 - 41 U/L Final   ALT 08/04/2024 17  0 - 44 U/L Final   Alkaline Phosphatase 08/04/2024 68  38 - 126 U/L Final   Total Bilirubin 08/04/2024 0.3  0.0 - 1.2 mg/dL Final   GFR, Estimated 08/04/2024 >60  >60 mL/min Final   Anion gap 08/04/2024 16 (H)  5 - 15 Final   WBC 08/04/2024 9.5  4.0 - 10.5 K/uL Final   RBC 08/04/2024 4.99  4.22 - 5.81 MIL/uL Final   Hemoglobin 08/04/2024 12.7 (L)  13.0 - 17.0 g/dL Final   HCT 90/85/7974 41.0  39.0 - 52.0 % Final   MCV 08/04/2024 82.2  80.0 - 100.0 fL Final   MCH 08/04/2024 25.5 (L)  26.0 - 34.0 pg Final   MCHC 08/04/2024 31.0  30.0 - 36.0 g/dL Final   RDW 90/85/7974 15.0  11.5 - 15.5 % Final   Platelets 08/04/2024 298  150 - 400 K/uL Final   nRBC 08/04/2024 0.0  0.0 - 0.2 % Final   Neutrophils Relative % 08/04/2024 72  % Final   Neutro Abs 08/04/2024 6.8  1.7 - 7.7 K/uL Final   Lymphocytes Relative 08/04/2024 22  % Final   Lymphs Abs 08/04/2024 2.1  0.7 - 4.0 K/uL Final   Monocytes Relative 08/04/2024 5  % Final   Monocytes Absolute 08/04/2024 0.5  0.1 - 1.0 K/uL Final   Eosinophils Relative 08/04/2024 0  % Final   Eosinophils Absolute 08/04/2024 0.0  0.0 - 0.5 K/uL Final   Basophils Relative 08/04/2024 1  % Final   Basophils Absolute 08/04/2024 0.1  0.0 - 0.1 K/uL Final   Immature Granulocytes 08/04/2024 0  % Final   Abs Immature Granulocytes 08/04/2024 0.03  0.00 - 0.07 K/uL Final    Alcohol, Ethyl (B) 08/04/2024 <15  <15 mg/dL Final   Acetaminophen  (Tylenol ), Serum 08/04/2024 <10 (L)  10 - 30 ug/mL Final   Salicylate Lvl 08/04/2024 <7.0 (L)  7.0 - 30.0 mg/dL Final   Troponin T High Sensitivity 08/04/2024 <15  0 - 19 ng/L Final   Troponin T High Sensitivity 08/04/2024 <15  0 - 19 ng/L Final   Opiates 08/05/2024 NEGATIVE  NEGATIVE Final   Cocaine 08/05/2024 NEGATIVE  NEGATIVE Final   Benzodiazepines 08/05/2024 POSITIVE (A)  NEGATIVE Final   Amphetamines 08/05/2024 NEGATIVE  NEGATIVE Final   Tetrahydrocannabinol 08/05/2024 POSITIVE (A)  NEGATIVE Final   Barbiturates 08/05/2024 NEGATIVE  NEGATIVE Final   Methadone  Scn, Ur 08/05/2024 POSITIVE (A)  NEGATIVE Final   Fentanyl  08/05/2024 NEGATIVE  NEGATIVE Final      Assessment & Plan  Matthew Roach is a 38 y.o. male  with a past psychiatric history of bipolar disorder with psychotic features, opioid use disorder, anxiety disorder. Patient initially arrived to [facility] on 9/14 for chest pain, and admitted to [facility] under IVC on  9/15 for stabilization of acute on chronic psychiatric conditions. PMHx is significant for seizures. Today, patient presents less confused compared to prior days, though some confusion persists. He denies anxiety, though he expresses uncertainty about this lack of anxiety, describing it as "confusing." He continues to report feeling "drugged" and emotionally blunted. He endorses intermittent muscle twitches since initiation of the Ativan  taper.  Overall, presentation today reflects some improvement in clarity compared to prior days, though with continued confusion and subjective emotional blunting. Delirium remains a consideration given waxing/waning symptoms, recent benzodiazepine taper, and reported muscle twitches. Manic features (paranoia, delusions) have not been prominent today. Sleep and sedation remain areas of concern, with discrepancy between patient's report and nursing observations.     # Delirium #  Sedative, hypnotic or anxiolytic-induced mood disorder - Continue olanzapine  15 mg nightly                         Pt was stable on Olanzapine  15mg  nightly at discharge from [facility], and when asked if her prefer the Olanzapine  or Seroquel , he said he had no preferrence and would be good to try the Olanzapine  again.                         QTcB , Lipid panel somewhat elevated in April. A1c nonconcerning in April  - Continue lithium  300 mg twice daily             - Continue Haldol  5mg  IM as a BACKUP if oral Olanzapine  is not taken - Continue Methadone  110mg  daily             - Continue IVC for concern of self-neglect from serious confusion putting pt at non-suicidal danger of harm to self                         - Submitted 9/16, due 9/23              - Finished Ativan  taper on 9/20.               - Stopped Prozac  20mg  daily                         Pt has bipolar disorder, and this was added recently to no known benefit.             - Stopped Seroquel  50mg  nightly                         Replaced with Olanzapine     Psychiatry General PRNs             -- Trazodone  50 mg once nightly as needed for insomnia             -- Hydroxyzine  25 mg 3 times daily as needed for anxiety             -- Agitation protocol: Haldol , Benadryl , lorazepam     Medical Issues Being Addressed:  #none   Medical General PRNs             - Tylenol  tablets 650 mg every 6 hours as needed for pain - Maalox/Mylanta suspension 30 mL every 4 hours as needed for indigestion - Milk of Magnesia 30 mL daily as needed for constipation     Discharge Planning:              --  Social work and case management to assist with discharge planning and identification of hospital follow-up needs prior to discharge             -- Estimated LOS: 7-10 days              -- Discharge Concerns: Need to establish a safety plan; Medication compliance and effectiveness             -- Discharge Goals: Return home with outpatient  referrals for mental health follow-up including medication management/psychotherapy    Alan Maiden, MD Psychiatry  PGY-1 08/11/2024, 10:14 AM

## 2024-08-11 NOTE — Progress Notes (Signed)
   08/10/24 2300  Psych Admission Type (Psych Patients Only)  Admission Status Involuntary  Psychosocial Assessment  Patient Complaints None  Eye Contact Fair  Facial Expression Flat  Affect Flat  Speech Slow  Interaction Cautious;Guarded  Motor Activity Slow  Appearance/Hygiene Disheveled  Behavior Characteristics Cooperative  Mood Anxious  Aggressive Behavior  Effect No apparent injury  Thought Process  Coherency Blocking;WDL  Content WDL  Delusions WDL  Perception Derealization;WDL  Hallucination None reported or observed  Judgment Impaired  Confusion None  Danger to Self  Current suicidal ideation? Denies  Danger to Others  Danger to Others None reported or observed

## 2024-08-11 NOTE — Progress Notes (Signed)
 Writer release all items out his locker(17) to his sister tonight after visitation. Pt and sister signed for the items.

## 2024-08-12 ENCOUNTER — Encounter (HOSPITAL_COMMUNITY): Payer: Self-pay

## 2024-08-12 MED ORDER — CLONAZEPAM 0.5 MG PO TABS
0.5000 mg | ORAL_TABLET | Freq: Every day | ORAL | Status: DC
  Administered 2024-08-12 – 2024-08-15 (×4): 0.5 mg via ORAL
  Filled 2024-08-12 (×4): qty 1

## 2024-08-12 NOTE — Group Note (Signed)
 Date:  08/12/2024 Time:  8:38 PM  Group Topic/Focus:  Wrap-Up Group:   The focus of this group is to help patients review their daily goal of treatment and discuss progress on daily workbooks.    Participation Level:  Active  Participation Quality:  Appropriate  Affect:  Appropriate  Cognitive:  Appropriate  Insight: Appropriate  Engagement in Group:  Engaged  Modes of Intervention:  Education and Exploration  Additional Comments:  Patient attended and participated in group tonight. He reports that his goal today was to do whatever is required of him like taking his medication and attending groups.  Zamyra Allensworth Dacosta 08/12/2024, 8:38 PM

## 2024-08-12 NOTE — [facility] Counselor (Signed)
 CSW met with patient regarding court hearing for tomorrow at 2pm. Patient endorsed wanting to attend court virtually. CSW relayed this message to Jenkins Primer, LCSWA for link to hearing.  Kierstyn Baranowski, LCSWA 08/12/24 10:23am

## 2024-08-12 NOTE — BH IP Treatment Plan (Signed)
 Interdisciplinary Treatment and Diagnostic Plan Update  08/12/2024 Time of Session: 12:30 PM - UPDATE Matthew Roach MRN: [id number]  Principal Diagnosis: Sedative, hypnotic or anxiolytic-induced mood disorder (HCC)  Secondary Diagnoses: Principal Problem:   Sedative, hypnotic or anxiolytic-induced mood disorder (HCC) Active Problems:   Bipolar 1 disorder (HCC)   Delirium, drug-induced   Current Medications:  Current Facility-Administered Medications  Medication Dose Route Frequency Provider Last Rate Last Admin   acetaminophen  (TYLENOL ) tablet 650 mg  650 mg Oral Q6H PRN Motley-Mangrum, Jadeka A, PMHNP       alum & mag hydroxide-simeth (MAALOX/MYLANTA) 200-200-20 MG/5ML suspension 30 mL  30 mL Oral Q4H PRN Motley-Mangrum, Jadeka A, PMHNP       clonazePAM  (KLONOPIN ) tablet 0.5 mg  0.5 mg Oral Daily Prunty, Donald B, DO   0.5 mg at 08/12/24 1017   haloperidol  (HALDOL ) tablet 5 mg  5 mg Oral TID PRN Motley-Mangrum, Jadeka A, PMHNP       And   diphenhydrAMINE  (BENADRYL ) capsule 50 mg  50 mg Oral TID PRN Motley-Mangrum, Jadeka A, PMHNP       haloperidol  lactate (HALDOL ) injection 5 mg  5 mg Intramuscular TID PRN Motley-Mangrum, Jadeka A, PMHNP       And   diphenhydrAMINE  (BENADRYL ) injection 50 mg  50 mg Intramuscular TID PRN Motley-Mangrum, Jadeka A, PMHNP       And   LORazepam  (ATIVAN ) injection 2 mg  2 mg Intramuscular TID PRN Motley-Mangrum, Jadeka A, PMHNP       haloperidol  lactate (HALDOL ) injection 10 mg  10 mg Intramuscular TID PRN Motley-Mangrum, Jadeka A, PMHNP       And   diphenhydrAMINE  (BENADRYL ) injection 50 mg  50 mg Intramuscular TID PRN Motley-Mangrum, Jadeka A, PMHNP       And   LORazepam  (ATIVAN ) injection 2 mg  2 mg Intramuscular TID PRN Motley-Mangrum, Jadeka A, PMHNP       hydrOXYzine  (ATARAX ) tablet 25 mg  25 mg Oral TID PRN Motley-Mangrum, Jadeka A, PMHNP   25 mg at 08/12/24 1541   influenza vac split trivalent PF (FLUZONE ) injection 0.5 mL  0.5 mL Intramuscular  Tomorrow-1000 Bobbitt, Shalon E, NP       lithium  carbonate (LITHOBID ) ER tablet 300 mg  300 mg Oral Q12H Prunty, Donald B, DO   300 mg at 08/12/24 9246   loperamide  (IMODIUM ) capsule 2 mg  2 mg Oral TID PRN Butler, Laura N, MD       magnesium  hydroxide (MILK OF MAGNESIA) suspension 30 mL  30 mL Oral Daily PRN Motley-Mangrum, Jadeka A, PMHNP       methadone  (DOLOPHINE ) tablet 110 mg  110 mg Oral Daily Towana Leita SAILOR, MD   110 mg at 08/12/24 0753   multivitamin with minerals tablet 1 tablet  1 tablet Oral Daily Pashayan, Alexander S, DO   1 tablet at 08/11/24 0750   nicotine  polacrilex (NICORETTE ) gum 2 mg  2 mg Oral PRN Towana Leita SAILOR, MD   2 mg at 08/12/24 1541   OLANZapine  (ZYPREXA ) tablet 15 mg  15 mg Oral QHS Prunty, Donald B, DO   15 mg at 08/11/24 2027   thiamine  (Vitamin B-1) tablet 100 mg  100 mg Oral Daily Pashayan, Alexander S, DO   100 mg at 08/10/24 9243   thiamine  (VITAMIN B1) injection 100 mg  100 mg Intramuscular Once Pashayan, Alexander S, DO       traZODone  (DESYREL ) tablet 100 mg  100 mg Oral QHS PRN Trudy,  Gaither, NP   100 mg at 08/09/24 2025   PTA Medications: Medications Prior to Admission  Medication Sig Dispense Refill Last Dose/Taking   clonazePAM  (KLONOPIN ) 1 MG tablet Take 1 mg by mouth 2 (two) times daily. (Patient not taking: Reported on 08/04/2024)      diazepam  (VALIUM ) 10 MG tablet Take 2 tablets (20 mg total) by mouth every 12 (twelve) hours as needed for anxiety. 120 tablet 5    FLUoxetine  (PROZAC ) 20 MG capsule Take 1 capsule (20 mg total) by mouth daily. 90 capsule 1    lithium  carbonate (ESKALITH ) 450 MG ER tablet Take 450 mg by mouth 2 (two) times daily. (Patient not taking: Reported on 08/04/2024)      methadone  (DOLOPHINE ) 10 MG/ML solution Take 125 mg by mouth daily.      mirtazapine (REMERON) 15 MG tablet Take 15 mg by mouth at bedtime. (Patient not taking: Reported on 08/04/2024)      OLANZapine  (ZYPREXA ) 2.5 MG tablet Take 2.5 mg by mouth at bedtime.  (Patient not taking: Reported on 08/04/2024)      QUEtiapine  (SEROQUEL ) 50 MG tablet TAKE 1 TABLET BY MOUTH EVERYDAY AT BEDTIME 90 tablet 1    traZODone  (DESYREL ) 50 MG tablet Take 50 mg by mouth at bedtime. (Patient not taking: Reported on 08/04/2024)       Patient Stressors: Other: I don't know    Patient Strengths: Other: Respectful  Treatment Modalities: Medication Management, Group therapy, Case management,  1 to 1 session with clinician, Psychoeducation, Recreational therapy.   Physician Treatment Plan for Primary Diagnosis: Sedative, hypnotic or anxiolytic-induced mood disorder (HCC) Long Term Goal(s):     Short Term Goals:    Medication Management: Evaluate patient's response, side effects, and tolerance of medication regimen.  Therapeutic Interventions: 1 to 1 sessions, Unit Group sessions and Medication administration.  Evaluation of Outcomes: Progressing  Physician Treatment Plan for Secondary Diagnosis: Principal Problem:   Sedative, hypnotic or anxiolytic-induced mood disorder (HCC) Active Problems:   Bipolar 1 disorder (HCC)   Delirium, drug-induced  Long Term Goal(s):     Short Term Goals:       Medication Management: Evaluate patient's response, side effects, and tolerance of medication regimen.  Therapeutic Interventions: 1 to 1 sessions, Unit Group sessions and Medication administration.  Evaluation of Outcomes: Progressing   RN Treatment Plan for Primary Diagnosis: Sedative, hypnotic or anxiolytic-induced mood disorder (HCC) Long Term Goal(s): Knowledge of disease and therapeutic regimen to maintain health will improve  Short Term Goals: Ability to remain free from injury will improve, Ability to verbalize frustration and anger appropriately will improve, Ability to verbalize feelings will improve, and Ability to disclose and discuss suicidal ideas  Medication Management: RN will administer medications as ordered by provider, will assess and evaluate  patient's response and provide education to patient for prescribed medication. RN will report any adverse and/or side effects to prescribing provider.  Therapeutic Interventions: 1 on 1 counseling sessions, Psychoeducation, Medication administration, Evaluate responses to treatment, Monitor vital signs and CBGs as ordered, Perform/monitor CIWA, COWS, AIMS and Fall Risk screenings as ordered, Perform wound care treatments as ordered.  Evaluation of Outcomes: Progressing   LCSW Treatment Plan for Primary Diagnosis: Sedative, hypnotic or anxiolytic-induced mood disorder (HCC) Long Term Goal(s): Safe transition to appropriate next level of care at discharge, Engage patient in therapeutic group addressing interpersonal concerns.  Short Term Goals: Engage patient in aftercare planning with referrals and resources, Increase ability to appropriately verbalize feelings, Facilitate acceptance of  mental health diagnosis and concerns, and Identify triggers associated with mental health/substance abuse issues  Therapeutic Interventions: Assess for all discharge needs, 1 to 1 time with Social worker, Explore available resources and support systems, Assess for adequacy in community support network, Educate family and significant other(s) on suicide prevention, Complete Psychosocial Assessment, Interpersonal group therapy.  Evaluation of Outcomes: Progressing   Progress in Treatment: Attending groups: Yes Participating in groups: Yes Taking medication as prescribed:  Yes. Toleration medication: Yes. Family/Significant other contact made: consents are pending Patient understands diagnosis: No. Discussing patient identified problems/goals with staff: No. Medical problems stabilized or resolved: Yes. Denies suicidal/homicidal ideation: Yes. Issues/concerns per patient self-inventory: No.   New problem(s) identified:  No   New Short Term/Long Term Goal(s):     medication stabilization, elimination of SI  thoughts, development of comprehensive mental wellness plan.      Patient Goals:  I'm not sure.  My memory is gone.    Discharge Plan or Barriers:  Patient recently admitted. CSW will continue to follow and assess for appropriate referrals and possible discharge planning.      Reason for Continuation of Hospitalization: Medication stabilization  Psychosis   Estimated Length of Stay:  4 - 6 days  Last 3 [place] Suicide Severity Risk Score: Flowsheet Row [facility] (Current) [facility] from [facility]24/2024 in [facility] ED from 01/22/2024 in [facility] Emergency Department at [facility]  C-SSRS RISK CATEGORY No Risk No Risk No Risk    Last PHQ 2/9 Scores:    07/10/2024    9:43 AM 06/24/2024    2:00 PM 12/21/2023   11:50 AM  Depression screen PHQ 2/9  Decreased Interest 2 3 0  Down, Depressed, Hopeless 2 2 1   PHQ - 2 Score 4 5 1   Altered sleeping 2 3 0  Tired, decreased energy 2 3 1   Change in appetite 2 3 2   Feeling bad or failure about yourself  2 1 0  Trouble concentrating 2 2 0  Moving slowly or fidgety/restless 2 2 0  Suicidal thoughts 2 0 0  PHQ-9 Score 18 19 4   Difficult doing work/chores  Extremely dIfficult Somewhat difficult    Scribe for Treatment Team: Kosha Jaquith O Markhi Kleckner, LCSWA 08/12/2024 6:45 PM

## 2024-08-12 NOTE — Plan of Care (Signed)
   Problem: Education: Goal: Emotional status will improve Outcome: Progressing Goal: Mental status will improve Outcome: Progressing Goal: Verbalization of understanding the information provided will improve Outcome: Progressing   Problem: Activity: Goal: Interest or engagement in activities will improve Outcome: Progressing

## 2024-08-12 NOTE — Group Note (Signed)
 LCSW Group Therapy Note   Group Date: 08/12/2024 Start Time: 1300 End Time: 1400   Participation:  patient was present and actively participated in the discussion.  Title: Finding Balance: Using [employer] for Thoughtful Decisions  Objective:  To help participants understand and apply the concept of Delsie Mind to make balanced, thoughtful decisions by integrating emotion and logic.  Goals: Learn the differences between Emotional Mind, Reasonable Mind, and [employer]. Recognize personal signs of Emotional and Reasonable Mind. Practice using [employer] in real-life scenarios.  Therapeutic Modalities: Elements of Dialectical Behavior Therapy (DBT):  Mindfulness (noticing thoughts and emotions without judgment), Emotion Regulation (understanding and managing emotional responses), Distress Tolerance (coping with difficult situations without making them worse), Wise Mind (integrating emotion and reason for balanced decision-making) Elements of Cognitive Behavioral Therapy (CBT):  Identifying automatic thoughts, Challenging cognitive distortions, Using logic to reframe unhelpful thinking patterns  Summary:  This class focused on Wise Mind--DBT's concept of balancing Emotional Mind and Reasonable Mind. We identified when we're in each state and practiced using Wise Mind to respond thoughtfully in real-life situations. By combining emotion and logic, participants can improve decision-making, manage challenges, and enhance relationships.   Tiffanee Mcnee O Aniesa Boback, LCSWA 08/12/2024  5:03 PM

## 2024-08-12 NOTE — Progress Notes (Signed)
(  Sleep Hours) - 5.25 (Any PRNs that were needed, meds refused, or side effects to meds)- PRN vistaril  25 mg given at pt request, no meds refused.  (Any disturbances and when (visitation, over night)- None  (Concerns raised by the patient)- None  (SI/HI/AVH)- Denies SI/HI/AVH

## 2024-08-12 NOTE — Plan of Care (Signed)
  Problem: Activity: Goal: Interest or engagement in activities will improve Outcome: Progressing   Problem: Coping: Goal: Ability to verbalize frustrations and anger appropriately will improve Outcome: Progressing   Problem: Physical Regulation: Goal: Ability to maintain clinical measurements within normal limits will improve Outcome: Progressing

## 2024-08-12 NOTE — Progress Notes (Signed)
 [facility] Inpatient Psychiatry Progress Note  Date: 08/12/2024 Patient: Matthew Roach MRN: [id number]   Subjective  Matthew Roach is a 38 y.o. male  with a past psychiatric history of bipolar disorder with psychotic features, opioid use disorder, anxiety disorder. Patient initially arrived to [facility] on 9/14 for chest pain, and admitted to [facility] under IVC on 9/15 for stabilization of acute on chronic psychiatric conditions. PMHx is significant for seizures.   Psychiatric History: Previous psychiatric diagnoses: Polar disorder with psychotic features, opioid use disorder, anxiety disorder, seizures Prior psychiatric treatment: Lithium , Prozac , Remeron, Suboxone, diazepam , trazodone , olanzapine , methadone  Psychiatric medication compliance history: Noncompliant   Current psychiatric treatment: Diazepam , lithium , trazodone , olanzapine , methadone  Current psychiatrist: PCP Dr. Kennyth, psychiatric management post [facility] hospitalization has been handled by PCP  Current therapist: Denies   Previous hospitalizations: 02/2024: [facility] History of suicide attempts: None History of self harm: None  Interval Update  08/12/24   Interval Update  08/12/24  No significant events overnight. MAR was reviewed and patient was compliant with medications yesterday. Case was discussed in the multidisciplinary team.   On interview patient reports ongoing confusion about his medication regimen. He does not like that his benzodiazepines were titrated at what he considers to be a very fast rate, and he is worried about going into benzodiazepine withdrawal seizures. He endorses that he has had significant anxiety, and that he would be more comfortable if he were able to be restarted on something. Continues to perseverate on possibly having early dementia from benzo overuse.  Medication changes: Add klonopin  0.5mg  daily  Discussed plan with patient to which they were agreeable. Pt  questions were answered.   Objective  Vitals: Blood pressure (!) 145/81, pulse (!) 105, temperature (!) 97.4 F (36.3 C), temperature source Oral, resp. rate 18, height 6' (1.829 m), weight 83.9 kg, SpO2 100%.   Physical Examination:  Physical Exam Vitals and nursing note reviewed. Exam conducted with a chaperone present.  Constitutional:      General: He is not in acute distress.    Appearance: He is normal weight.  Pulmonary:     Effort: Pulmonary effort is normal. No respiratory distress.  Skin:    General: Skin is warm and dry.  Neurological:     Mental Status: He is alert.     Gait: Gait normal.    Review of Systems  Constitutional: Negative.  Negative for fever.  Respiratory: Negative.  Negative for shortness of breath.   Cardiovascular: Negative.   Gastrointestinal: Negative.  Negative for constipation and diarrhea.     Musculoskeletal:  Strength & Muscle Tone: within normal limits Gait & Station: normal   Mental Status Exam  Apperance: Appropriate for environment and Sitting upright Behavior: Calm Speech: Normal Rate, Articulate, Normal Volume, and Responsive Attitude: Cooperative Mood: anxious Affect: ANXIOUS, BLUNTED, and Mood Congruent Perception: Not responding to internal stimuli Thought Content: within normal limits Thought Form: Goal Directed, Linear, and PERSEVERATION Cognition: Alert & Oriented to person, place, and time, Recent and Remote memory grossly intact by recounting personal history, and Immediate memory grossly intact by interview Judgment: POOR Insight: POOR   Key Points: Denies SI, HI, A&VH    Lab Results:   No visits with results within 1 Day(s) from this visit.  Latest known visit with results is:  Admission on 08/04/2024, Discharged on 08/05/2024  Component Date Value Ref Range Status   Sodium 08/04/2024 142  135 - 145 mmol/L Final   Potassium 08/04/2024 4.1  3.5 - 5.1 mmol/L Final   Chloride 08/04/2024 102  98 - 111  mmol/L Final   CO2 08/04/2024 24  22 - 32 mmol/L Final   Glucose, Bld 08/04/2024 105 (H)  70 - 99 mg/dL Final   BUN 90/85/7974 20  6 - 20 mg/dL Final   Creatinine, Ser 08/04/2024 0.82  0.61 - 1.24 mg/dL Final   Calcium 90/85/7974 9.6  8.9 - 10.3 mg/dL Final   Total Protein 90/85/7974 7.6  6.5 - 8.1 g/dL Final   Albumin 90/85/7974 4.8  3.5 - 5.0 g/dL Final   AST 90/85/7974 24  15 - 41 U/L Final   ALT 08/04/2024 17  0 - 44 U/L Final   Alkaline Phosphatase 08/04/2024 68  38 - 126 U/L Final   Total Bilirubin 08/04/2024 0.3  0.0 - 1.2 mg/dL Final   GFR, Estimated 08/04/2024 >60  >60 mL/min Final   Anion gap 08/04/2024 16 (H)  5 - 15 Final   WBC 08/04/2024 9.5  4.0 - 10.5 K/uL Final   RBC 08/04/2024 4.99  4.22 - 5.81 MIL/uL Final   Hemoglobin 08/04/2024 12.7 (L)  13.0 - 17.0 g/dL Final   HCT 90/85/7974 41.0  39.0 - 52.0 % Final   MCV 08/04/2024 82.2  80.0 - 100.0 fL Final   MCH 08/04/2024 25.5 (L)  26.0 - 34.0 pg Final   MCHC 08/04/2024 31.0  30.0 - 36.0 g/dL Final   RDW 90/85/7974 15.0  11.5 - 15.5 % Final   Platelets 08/04/2024 298  150 - 400 K/uL Final   nRBC 08/04/2024 0.0  0.0 - 0.2 % Final   Neutrophils Relative % 08/04/2024 72  % Final   Neutro Abs 08/04/2024 6.8  1.7 - 7.7 K/uL Final   Lymphocytes Relative 08/04/2024 22  % Final   Lymphs Abs 08/04/2024 2.1  0.7 - 4.0 K/uL Final   Monocytes Relative 08/04/2024 5  % Final   Monocytes Absolute 08/04/2024 0.5  0.1 - 1.0 K/uL Final   Eosinophils Relative 08/04/2024 0  % Final   Eosinophils Absolute 08/04/2024 0.0  0.0 - 0.5 K/uL Final   Basophils Relative 08/04/2024 1  % Final   Basophils Absolute 08/04/2024 0.1  0.0 - 0.1 K/uL Final   Immature Granulocytes 08/04/2024 0  % Final   Abs Immature Granulocytes 08/04/2024 0.03  0.00 - 0.07 K/uL Final   Alcohol, Ethyl (B) 08/04/2024 <15  <15 mg/dL Final   Acetaminophen  (Tylenol ), Serum 08/04/2024 <10 (L)  10 - 30 ug/mL Final   Salicylate Lvl 08/04/2024 <7.0 (L)  7.0 - 30.0 mg/dL Final    Troponin T High Sensitivity 08/04/2024 <15  0 - 19 ng/L Final   Troponin T High Sensitivity 08/04/2024 <15  0 - 19 ng/L Final   Opiates 08/05/2024 NEGATIVE  NEGATIVE Final   Cocaine 08/05/2024 NEGATIVE  NEGATIVE Final   Benzodiazepines 08/05/2024 POSITIVE (A)  NEGATIVE Final   Amphetamines 08/05/2024 NEGATIVE  NEGATIVE Final   Tetrahydrocannabinol 08/05/2024 POSITIVE (A)  NEGATIVE Final   Barbiturates 08/05/2024 NEGATIVE  NEGATIVE Final   Methadone  Scn, Ur 08/05/2024 POSITIVE (A)  NEGATIVE Final   Fentanyl  08/05/2024 NEGATIVE  NEGATIVE Final      Assessment & Plan  Matthew Roach is a 38 y.o. male  with a past psychiatric history of bipolar disorder with psychotic features, opioid use disorder, anxiety disorder. Patient initially arrived to [facility] on 9/14 for chest pain, and admitted to [facility] under IVC on 9/15 for stabilization of acute on chronic psychiatric  conditions. PMHx is significant for seizures.    Patient presentation continues to be odd.  There is underlying paranoia about where he is leading to medication noncompliance.  The confused picture is not consistent with primary psychiatric diagnoses.  May be a substance-induced delirium, odd presentation for disorganized thought, a catatonic picture, or something else.  Chart documentation has strong evidence for an underlying bipolar disorder, which may or may not be contributing to this encounter.  Plan is roughly to continue the Valium  to help prevent seizures from withdrawal, stop offending agents, continues methadone , and get him on an antipsychotic.  IVC should stay in place as he cannot care for himself in this state.  It is unpredictable whether or not the psychotic component could lead to danger to others.   # Delirium # Sedative, hypnotic or anxiolytic-induced mood disorder - Increase Olanzapine  to 10mg  for 9/20 at bedtime, then consolidate morning and evening dose to 15mg  nightly starting 9/21                         Pt was  stable on Olanzapine  15mg  nightly at discharge from [facility], and when asked if her prefer the Olanzapine  or Seroquel , he said he had no preferrence and would be good to try the Olanzapine  again.                         QTcB , Lipid panel somewhat elevated in April. A1c nonconcerning in April  - Start Klonopin  0.5mg  daily for anxiety  - Continue Haldol  5mg  IM as a BACKUP if oral Olanzapine  is not taken - Continue Methadone  110mg  daily             - Continue IVC for concern of self-neglect from serious confusion putting pt at non-suicidal danger of harm to self                         - Submitted 9/16, due 9/23   - Finished Ativan  taper on 9/20.    - Stopped Prozac  20mg  daily                         Pt has bipolar disorder, and this was added recently to no known benefit.             - Stopped Seroquel  50mg  nightly                         Replaced with Olanzapine     Psychiatry General PRNs             -- Trazodone  50 mg once nightly as needed for insomnia             -- Hydroxyzine  25 mg 3 times daily as needed for anxiety             -- Agitation protocol: Haldol , Benadryl , lorazepam     Medical Issues Being Addressed:  #none   Medical General PRNs             - Tylenol  tablets 650 mg every 6 hours as needed for pain - Maalox/Mylanta suspension 30 mL every 4 hours as needed for indigestion - Milk of Magnesia 30 mL daily as needed for constipation     Discharge Planning:              -- Social work and  case management to assist with discharge planning and identification of hospital follow-up needs prior to discharge             -- Estimated LOS: 7-10 days              -- Discharge Concerns: Need to establish a safety plan; Medication compliance and effectiveness             -- Discharge Goals: Return home with outpatient referrals for mental health follow-up including medication management/psychotherapy   D. Penne Mori Psychiatry  PGY-1 08/12/2024, 4:09 PM

## 2024-08-12 NOTE — Progress Notes (Signed)
   08/12/24 0800  Psych Admission Type (Psych Patients Only)  Admission Status Involuntary  Psychosocial Assessment  Patient Complaints None  Eye Contact Fair  Facial Expression Anxious;Flat  Affect Anxious;Depressed  Speech Logical/coherent  Interaction Guarded  Motor Activity Other (Comment) (WDL)  Appearance/Hygiene Unremarkable  Behavior Characteristics Cooperative;Appropriate to situation  Mood Anxious  Thought Process  Coherency WDL  Content WDL  Delusions WDL  Perception WDL  Hallucination None reported or observed  Judgment Impaired  Confusion None  Danger to Self  Current suicidal ideation? Denies  Agreement Not to Harm Self Yes  Description of Agreement Verbal  Danger to Others  Danger to Others None reported or observed

## 2024-08-12 NOTE — Group Note (Signed)
 Recreation Therapy Group Note   Group Topic:Personal Development  Group Date: 08/12/2024 Start Time: 1016 End Time: 1035 Facilitators: Romaine Maciolek-McCall, LRT,CTRS Location: [address] Hall Dayroom   Group Topic: Personal Development  Goal Area(s) Addresses:  Patient will successfully define what peace to them. Patient will successfully identify benefit of finding peace. Patient will successfully identify how peace impacts their lives.    Behavioral Response:   Intervention: Worksheet  Activity: LRT and patients discussed what peace is. Patients were then given a worksheet were they had to identify how peace and it's impact has affected their lives. Patients were to address things such as change, relationships, goals, etc.   Education: Geophysicist/field seismologist  Education Outcome: Acknowledge education, In group clarification offered, Needs additional education   Affect/Mood: N/A   Participation Level: Did not attend    Clinical Observations/Individualized Feedback:      Plan: Continue to engage patient in RT group sessions 2-3x/week.   Kadesia Robel-McCall, LRT,CTRS 08/12/2024 12:38 PM

## 2024-08-13 DIAGNOSIS — F1320 Sedative, hypnotic or anxiolytic dependence, uncomplicated: Secondary | ICD-10-CM

## 2024-08-13 MED ORDER — OLANZAPINE 5 MG PO TABS
5.0000 mg | ORAL_TABLET | Freq: Every day | ORAL | Status: DC
  Administered 2024-08-13 – 2024-08-20 (×8): 5 mg via ORAL
  Filled 2024-08-13 (×8): qty 1

## 2024-08-13 NOTE — Progress Notes (Signed)
 [facility] Inpatient Psychiatry Progress Note  Date: 08/13/2024 Patient: Matthew Roach MRN: [id number]   Subjective  Matthew Roach is a 38 y.o. male  with a past psychiatric history of bipolar disorder with psychotic features, opioid use disorder, anxiety disorder. Patient initially arrived to [facility] on 9/14 for chest pain, and admitted to [facility] under IVC on 9/15 for stabilization of acute on chronic psychiatric conditions. PMHx is significant for seizures.   Psychiatric History: Previous psychiatric diagnoses: Polar disorder with psychotic features, opioid use disorder, anxiety disorder, seizures Prior psychiatric treatment: Lithium , Prozac , Remeron, Suboxone, diazepam , trazodone , olanzapine , methadone  Psychiatric medication compliance history: Noncompliant   Current psychiatric treatment: Diazepam , lithium , trazodone , olanzapine , methadone  Current psychiatrist: PCP Dr. Kennyth, psychiatric management post [facility] hospitalization has been handled by PCP  Current therapist: Denies   Previous hospitalizations: 02/2024: [facility] History of suicide attempts: None History of self harm: None   Interval Update  08/13/24  No significant events overnight. MAR was reviewed and patient was compliant with medications yesterday. Case was discussed in the multidisciplinary team.   On interview patient reports worsening paranoia. The concerns about the methadone  clinic director have returned, and the patient is worried that one of the patients on the unit has specifically targeted him for some unknown reason. Matthew Roach denies SI, HI, A&VH, and again brought up being on a higher dose of klonopin  because of his previously very high dose. We discussed adding 5mg  of Olanzapine  in the morning to help with his anxiety, to which he was agreeable. Pt also brought up concerns of chest pain, and concern that his aorta may be exploding. We discussed that this was investigated in the emergency  department and determined to not be a concern, but patient requested that we do some kind of scan to ensure his heart is ok. We discussed getting an EKG, which was ordered to evaluate his chronic chest pain.  9/23 Medication changes: Added olanzapine  5mg  daily  Objective  Vitals: Blood pressure 131/84, pulse 93, temperature (!) 97.5 F (36.4 C), temperature source Oral, resp. rate 18, height 6' (1.829 m), weight 83.9 kg, SpO2 96%.   Physical Examination:  Physical Exam Vitals and nursing note reviewed. Exam conducted with a chaperone present.  Constitutional:      General: He is not in acute distress.    Appearance: He is normal weight.  Pulmonary:     Effort: Pulmonary effort is normal. No respiratory distress.  Skin:    General: Skin is warm and dry.  Neurological:     Mental Status: He is alert.     Gait: Gait normal.    Review of Systems  Constitutional: Negative.  Negative for fever.  Respiratory: Negative.  Negative for shortness of breath.   Cardiovascular:  Positive for chest pain. Negative for palpitations and leg swelling.  Gastrointestinal: Negative.  Negative for constipation and diarrhea.  Neurological:  Negative for dizziness.     Musculoskeletal:  Strength & Muscle Tone: within normal limits Gait & Station: normal   Mental Status Exam  Apperance: Appropriate for environment, Casual, Sitting upright, and Standing Behavior: Calm Speech: Normal Rate, Articulate, Normal Volume, and Responsive Attitude: Cooperative and Friendly Mood: anxious Affect: ANXIOUS, RESTRICTED, and Mood Congruent Perception: Not responding to internal stimuli Thought Content: within normal limits Thought Form: Goal Directed, Organized, Linear, and Logical Cognition: Alert & Oriented to person, place, and time, Recent and Remote memory grossly intact by recounting personal history, and Immediate memory grossly intact by interview Judgment:  POOR Insight: POOR   Key Points:  Denies SI, HI, A&VH     Lab Results:   No visits with results within 1 Day(s) from this visit.  Latest known visit with results is:  Admission on 08/04/2024, Discharged on 08/05/2024  Component Date Value Ref Range Status   Sodium 08/04/2024 142  135 - 145 mmol/L Final   Potassium 08/04/2024 4.1  3.5 - 5.1 mmol/L Final   Chloride 08/04/2024 102  98 - 111 mmol/L Final   CO2 08/04/2024 24  22 - 32 mmol/L Final   Glucose, Bld 08/04/2024 105 (H)  70 - 99 mg/dL Final   BUN 90/85/7974 20  6 - 20 mg/dL Final   Creatinine, Ser 08/04/2024 0.82  0.61 - 1.24 mg/dL Final   Calcium 90/85/7974 9.6  8.9 - 10.3 mg/dL Final   Total Protein 90/85/7974 7.6  6.5 - 8.1 g/dL Final   Albumin 90/85/7974 4.8  3.5 - 5.0 g/dL Final   AST 90/85/7974 24  15 - 41 U/L Final   ALT 08/04/2024 17  0 - 44 U/L Final   Alkaline Phosphatase 08/04/2024 68  38 - 126 U/L Final   Total Bilirubin 08/04/2024 0.3  0.0 - 1.2 mg/dL Final   GFR, Estimated 08/04/2024 >60  >60 mL/min Final   Anion gap 08/04/2024 16 (H)  5 - 15 Final   WBC 08/04/2024 9.5  4.0 - 10.5 K/uL Final   RBC 08/04/2024 4.99  4.22 - 5.81 MIL/uL Final   Hemoglobin 08/04/2024 12.7 (L)  13.0 - 17.0 g/dL Final   HCT 90/85/7974 41.0  39.0 - 52.0 % Final   MCV 08/04/2024 82.2  80.0 - 100.0 fL Final   MCH 08/04/2024 25.5 (L)  26.0 - 34.0 pg Final   MCHC 08/04/2024 31.0  30.0 - 36.0 g/dL Final   RDW 90/85/7974 15.0  11.5 - 15.5 % Final   Platelets 08/04/2024 298  150 - 400 K/uL Final   nRBC 08/04/2024 0.0  0.0 - 0.2 % Final   Neutrophils Relative % 08/04/2024 72  % Final   Neutro Abs 08/04/2024 6.8  1.7 - 7.7 K/uL Final   Lymphocytes Relative 08/04/2024 22  % Final   Lymphs Abs 08/04/2024 2.1  0.7 - 4.0 K/uL Final   Monocytes Relative 08/04/2024 5  % Final   Monocytes Absolute 08/04/2024 0.5  0.1 - 1.0 K/uL Final   Eosinophils Relative 08/04/2024 0  % Final   Eosinophils Absolute 08/04/2024 0.0  0.0 - 0.5 K/uL Final   Basophils Relative 08/04/2024 1  %  Final   Basophils Absolute 08/04/2024 0.1  0.0 - 0.1 K/uL Final   Immature Granulocytes 08/04/2024 0  % Final   Abs Immature Granulocytes 08/04/2024 0.03  0.00 - 0.07 K/uL Final   Alcohol, Ethyl (B) 08/04/2024 <15  <15 mg/dL Final   Acetaminophen  (Tylenol ), Serum 08/04/2024 <10 (L)  10 - 30 ug/mL Final   Salicylate Lvl 08/04/2024 <7.0 (L)  7.0 - 30.0 mg/dL Final   Troponin T High Sensitivity 08/04/2024 <15  0 - 19 ng/L Final   Troponin T High Sensitivity 08/04/2024 <15  0 - 19 ng/L Final   Opiates 08/05/2024 NEGATIVE  NEGATIVE Final   Cocaine 08/05/2024 NEGATIVE  NEGATIVE Final   Benzodiazepines 08/05/2024 POSITIVE (A)  NEGATIVE Final   Amphetamines 08/05/2024 NEGATIVE  NEGATIVE Final   Tetrahydrocannabinol 08/05/2024 POSITIVE (A)  NEGATIVE Final   Barbiturates 08/05/2024 NEGATIVE  NEGATIVE Final   Methadone  Scn, Ur 08/05/2024 POSITIVE (A)  NEGATIVE  Final   Fentanyl  08/05/2024 NEGATIVE  NEGATIVE Final      Assessment & Plan  Principal Problem:   Sedative, hypnotic or anxiolytic use disorder, severe, in controlled environment [facility]) Active Problems:   Bipolar 1 disorder (HCC)   Delirium, drug-induced   Keegan Ducey is a 38 y.o. male  with a past psychiatric history of bipolar disorder with psychotic features, opioid use disorder, anxiety disorder. Patient initially arrived to [facility] on 9/14 for chest pain, and admitted to [facility] under IVC on 9/15 for stabilization of acute on chronic psychiatric conditions. PMHx is significant for seizures.    Patient presentation continues to be odd.  There is underlying paranoia about where he is leading to medication noncompliance.  The confused picture is not consistent with primary psychiatric diagnoses.  May be a substance-induced delirium, odd presentation for disorganized thought, a catatonic picture, or something else.  Chart documentation has strong evidence for an underlying bipolar disorder, which may or may not be contributing to this  encounter.  Plan is roughly to continue the Valium  to help prevent seizures from withdrawal, stop offending agents, continues methadone , and get him on an antipsychotic.  IVC should stay in place as he cannot care for himself in this state.  It is unpredictable whether or not the psychotic component could lead to danger to others.   # Delirium, drug-induced # Sedative, hypnotic or anxiolytic-induced mood disorder, severe, in controlled environment - Start Olanzapine  5mg  in the morning, and continue Olanzapine  15mg  at night for anxiety and paranoia.                         Pt was stable on Olanzapine  15mg  nightly at discharge from [facility], and when asked if her prefer the Olanzapine  or Seroquel , he said he had no preferrence and would be good to try the Olanzapine  again.                         QTcB , Lipid panel somewhat elevated in April. A1c nonconcerning in April  - Continue Klonopin  0.5mg  daily for anxiety  - Continue Haldol  5mg  IM as a BACKUP if oral Olanzapine  is not taken - Continue Methadone  110mg  daily             - Continue IVC for concern of self-neglect from serious confusion putting pt at non-suicidal danger of harm to self                         - Submitted 9/16, due 9/23   - Finished Ativan  taper on 9/20.    - Stopped Prozac  20mg  daily                         Pt has bipolar disorder, and this was added recently to no known benefit.             - Stopped Seroquel  50mg  nightly                         Replaced with Olanzapine     Psychiatry General PRNs             -- Trazodone  50 mg once nightly as needed for insomnia             -- Hydroxyzine  25 mg 3 times daily as needed for anxiety             --  Agitation protocol: Haldol , Benadryl , lorazepam     Medical Issues Being Addressed:  #none   Medical General PRNs             - Tylenol  tablets 650 mg every 6 hours as needed for pain - Maalox/Mylanta suspension 30 mL every 4 hours as needed for indigestion - Milk of  Magnesia 30 mL daily as needed for constipation     Discharge Planning:              -- Social work and case management to assist with discharge planning and identification of hospital follow-up needs prior to discharge             -- Estimated LOS: 7-14 days              -- Discharge Concerns: Need to establish a safety plan; Medication compliance and effectiveness             -- Discharge Goals: Return home with outpatient referrals for mental health follow-up including medication management/psychotherapy   D. Penne Mori Psychiatry  PGY-1 08/13/2024, 1:05 PM

## 2024-08-13 NOTE — Plan of Care (Signed)
   Problem: Education: Goal: Emotional status will improve Outcome: Progressing Goal: Mental status will improve Outcome: Progressing Goal: Verbalization of understanding the information provided will improve Outcome: Progressing   Problem: Activity: Goal: Interest or engagement in activities will improve Outcome: Progressing

## 2024-08-13 NOTE — Progress Notes (Addendum)
(  Sleep Hours) -7.5  (Any PRNs that were needed, meds refused, or side effects to meds)- Vistaril  25 mg, Trazodone  50 mg  (Any disturbances and when (visitation, over night)-n/a  (Concerns raised by the patient)- none  (SI/HI/AVH)-denies

## 2024-08-13 NOTE — Progress Notes (Signed)
(  Sleep Hours) -6.5  (Any PRNs that were needed, meds refused, or side effects to meds)- vistaril  25 mg, Trazodone  100 mg  (Any disturbances and when (visitation, over night)-n/a  (Concerns raised by the patient)-  Pt would like to talk to SW about possibly paying some bills online    (SI/HI/AVH)-denies

## 2024-08-13 NOTE — Group Note (Signed)
 Recreation Therapy Group Note   Group Topic:Problem Solving  Group Date: 08/13/2024 Start Time: 1040 End Time: 1100 Facilitators: Annleigh Knueppel-McCall, LRT,CTRS Location: [address] Dayroom   Group Topic: Problem Solving   Goal Area(s) Addresses:  Patient will effectively work in a team with other group members. Patient will verbalize importance of using appropriate problem solving techniques.   Behavioral Response:   Intervention: Brain Teasers, Codebreakers  Activity: Patients were given two worksheets that had various brain teasers and code breakers. Patients could work together to figure out the teasers and crack the codes presented. LRT and patients would go over the answers once completed.   Education: Problem Solving  Education Outcome: Acknowledges understanding/In group clarification offered/Needs additional education.    Affect/Mood: N/A   Participation Level: Did not attend    Clinical Observations/Individualized Feedback:    Plan: Continue to engage patient in RT group sessions 2-3x/week.   Monte Bronder-McCall, LRT,CTRS 08/13/2024 1:40 PM

## 2024-08-13 NOTE — Plan of Care (Signed)
   Problem: Education: Goal: Emotional status will improve Outcome: Progressing Goal: Mental status will improve Outcome: Progressing   Problem: Activity: Goal: Sleeping patterns will improve Outcome: Progressing

## 2024-08-13 NOTE — Group Note (Signed)
 Date:  08/13/2024 Time:  8:57 PM  Group Topic/Focus:  Wrap-Up Group:   The focus of this group is to help patients review their daily goal of treatment and discuss progress on daily workbooks.    Participation Level:  Active  Participation Quality:  Appropriate  Affect:  Appropriate  Cognitive:  Appropriate  Insight: Appropriate  Engagement in Group:  Developing/Improving  Modes of Intervention:  Discussion  Additional Comments:  Pt stated his goal for today was to focus on his treatment plan. Pt stated he accomplished his goal today. Pt stated he talked with his doctor and social worker about his care today. Pt rated his overall day a 4 out of 10. Pt stated his brother coming for visitation tonight improved his over all day. Pt stated he felt better about himself today. Pt stated he was able to attend all meals. Pt stated he took all medications provided today. Pt stated he attend all groups held today. Pt stated his appetite was pretty good today. Pt rated sleep last night was fair. Pt stated the goal tonight was to get some rest. Pt stated he had some physical pain tonight.. Pt stated he had some mild pain in his chest. Pt rated the mild pain in his chest a 4 on the pain level scale. Pt nurse was updated on the situation. Pt deny visual hallucinations and auditory issues tonight. Pt denies thoughts of harming himself or others. Pt stated he would alert staff if anything changed  Lonni Na 08/13/2024, 8:57 PM

## 2024-08-13 NOTE — Plan of Care (Signed)
   Problem: Education: Goal: Emotional status will improve Outcome: Progressing Goal: Mental status will improve Outcome: Progressing   Problem: Activity: Goal: Interest or engagement in activities will improve Outcome: Progressing Goal: Sleeping patterns will improve Outcome: Progressing

## 2024-08-13 NOTE — Progress Notes (Signed)
 Pt approached nurse asking to talk. Pt believes he had a seizure last night because he feels forgetful today. Pt also states he is afraid he is getting dementia. Pt states he needs a higher dose of klonopin  and methadone . Nurse deferred to provider. Pt endorses increased anxiety and brain zaps. Pt denies SI/HI/AVH.

## 2024-08-14 MED ORDER — HALOPERIDOL 5 MG PO TABS
5.0000 mg | ORAL_TABLET | Freq: Two times a day (BID) | ORAL | Status: DC
Start: 2024-08-14 — End: 2024-08-20
  Administered 2024-08-14 – 2024-08-20 (×13): 5 mg via ORAL
  Filled 2024-08-14 (×13): qty 1

## 2024-08-14 NOTE — Plan of Care (Signed)
  Problem: Education: Goal: Knowledge of [facility] Education information/materials will improve 08/14/2024 1453 by Lesli Ronnald HERO, RN Outcome: Progressing 08/14/2024 1453 by Lesli Ronnald HERO, RN Outcome: Progressing Goal: Emotional status will improve 08/14/2024 1453 by Lesli Ronnald HERO, RN Outcome: Progressing 08/14/2024 1453 by Lesli Ronnald HERO, RN Outcome: Progressing Goal: Mental status will improve 08/14/2024 1453 by Lesli Ronnald HERO, RN Outcome: Progressing 08/14/2024 1453 by Lesli Ronnald HERO, RN Outcome: Progressing Goal: Verbalization of understanding the information provided will improve 08/14/2024 1453 by Lesli Ronnald HERO, RN Outcome: Progressing 08/14/2024 1453 by Lesli Ronnald HERO, RN Outcome: Progressing   Problem: Activity: Goal: Interest or engagement in activities will improve 08/14/2024 1453 by Lesli Ronnald HERO, RN Outcome: Progressing 08/14/2024 1453 by Lesli Ronnald HERO, RN Outcome: Progressing Goal: Sleeping patterns will improve 08/14/2024 1453 by Lesli Ronnald HERO, RN Outcome: Progressing 08/14/2024 1453 by Lesli Ronnald HERO, RN Outcome: Progressing   Problem: Coping: Goal: Ability to verbalize frustrations and anger appropriately will improve 08/14/2024 1453 by Lesli Ronnald HERO, RN Outcome: Progressing 08/14/2024 1453 by Lesli Ronnald HERO, RN Outcome: Progressing Goal: Ability to demonstrate self-control will improve 08/14/2024 1453 by Lesli Ronnald HERO, RN Outcome: Progressing 08/14/2024 1453 by Lesli Ronnald HERO, RN Outcome: Progressing

## 2024-08-14 NOTE — Plan of Care (Signed)
   Problem: Education: Goal: Knowledge of [facility] Education information/materials will improve Outcome: Progressing   Problem: Activity: Goal: Interest or engagement in activities will improve Outcome: Progressing   Problem: Coping: Goal: Ability to verbalize frustrations and anger appropriately will improve Outcome: Progressing   Problem: Safety: Goal: Periods of time without injury will increase Outcome: Progressing
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
## 2024-08-14 NOTE — [facility] Group Notes (Signed)
 Adult Psychoeducational Group Note  Date:  08/14/2024 Time:  4:56 PM  Group Topic/Focus:  Goals Group:   The focus of this group is to help patients establish daily goals to achieve during treatment and discuss how the patient can incorporate goal setting into their daily lives to aide in recovery. Orientation:   The focus of this group is to educate the patient on the purpose and policies of crisis stabilization and provide a format to answer questions about their admission.  The group details unit policies and expectations of patients while admitted.  Participation Level:  Did Not Attend  Participation Quality:    Affect:    Cognitive:    Insight:   Engagement in Group:    Modes of Intervention:    Additional Comments:    Beuna Bolding O 08/14/2024, 4:56 PM

## 2024-08-14 NOTE — [facility] Counselor (Signed)
 Type of Note: [address] Hearing  The courts have extended the patient's IVC for an additional 7 days as of 08/13/24.  Signed Rey Dansby, LCSWA

## 2024-08-14 NOTE — Progress Notes (Signed)
 Patient isolative to his room the majority of the day and remains focused and worried about his aortic and heart.  08/14/24 0759  Psych Admission Type (Psych Patients Only)  Admission Status Involuntary  Psychosocial Assessment  Patient Complaints Worrying  Eye Contact Fair  Facial Expression Anxious;Flat  Affect Anxious;Depressed  Speech Logical/coherent  Interaction Guarded  Motor Activity Slow  Appearance/Hygiene Unremarkable  Behavior Characteristics Cooperative  Mood Anxious  Thought Process  Coherency WDL  Content WDL  Delusions WDL  Perception WDL  Hallucination None reported or observed  Judgment WDL  Confusion WDL  Danger to Self  Current suicidal ideation? Denies  Danger to Others  Danger to Others None reported or observed

## 2024-08-14 NOTE — Group Note (Signed)
 Date:  08/14/2024 Time:  9:04 PM  Group Topic/Focus:  Wrap-Up Group:   The focus of this group is to help patients review their daily goal of treatment and discuss progress on daily workbooks.    Participation Level:  Did Not Attend  Participation Quality:  Did Not Attend  Affect:  Did Not Attend  Cognitive:  Did Not Attend  Insight: None  Engagement in Group:  Did Not Attend  Modes of Intervention:  Did Not Attend  Additional Comments:  Pt was encouraged to attend wrap up group but did not attend.  Lonni Na 08/14/2024, 9:04 PM

## 2024-08-14 NOTE — Group Note (Signed)
 Recreation Therapy Group Note   Group Topic:Team Building  Group Date: 08/14/2024 Start Time: 1010 End Time: 1050 Facilitators: Kayon Dozier-McCall, LRT,CTRS Location: [address] Dayroom   Group Topic: Communication, Team Building, Problem Solving  Goal Area(s) Addresses:  Patient will effectively work with peer towards shared goal.  Patient will identify skills used to make activity successful.  Patient will share challenges and verbalize solution-driven approaches used. Patient will identify how skills used during activity can be used to reach post d/c goals.   Behavioral Response: None  Intervention: STEM Activity   Activity: [employer]. Patients were provided the following materials: 5 drinking straws, 5 rubber bands, 5 paper clips, 2 index cards, 2 drinking cups, and 2 toilet paper rolls. Using the provided materials patients were asked to build a launching mechanism to launch a ping pong ball across the room, approximately 10 feet. Patients were divided into teams of 3-5. Instructions required all materials be incorporated into the device, functionality of items left to the peer group's discretion.  Education: Pharmacist, community, Scientist, physiological, Air cabin crew, Building control surveyor.   Education Outcome: Acknowledges education/In group clarification offered/Needs additional education.    Affect/Mood: Flat and Sleepy   Participation Level: None   Participation Quality: None   Behavior: On-looking   Speech/Thought Process: None   Insight: None   Judgement: None   Modes of Intervention: STEM Activity   Patient Response to Interventions:  Disengaged   Education Outcome:  In group clarification offered    Clinical Observations/Individualized Feedback: Pt was sitting by window eating his snack. Pt eventually fell asleep in group session.      Plan: Continue to engage patient in RT group sessions 2-3x/week.   Emika Tiano-McCall, LRT,CTRS 08/14/2024 1:09 PM

## 2024-08-15 MED ORDER — CLONAZEPAM 0.5 MG PO TABS
0.5000 mg | ORAL_TABLET | Freq: Two times a day (BID) | ORAL | Status: DC
  Administered 2024-08-15 – 2024-08-20 (×10): 0.5 mg via ORAL
  Filled 2024-08-15 (×10): qty 1

## 2024-08-15 MED ORDER — FLUOXETINE HCL 20 MG PO CAPS
20.0000 mg | ORAL_CAPSULE | Freq: Every day | ORAL | Status: DC
  Administered 2024-08-15 – 2024-08-17 (×3): 20 mg via ORAL
  Filled 2024-08-15 (×3): qty 1

## 2024-08-15 NOTE — Progress Notes (Signed)
(  Sleep Hours) -8.25  (Any PRNs that were needed, meds refused, or side effects to meds)- Vistaril  25 mg , Trazodone  100 mg  (Any disturbances and when (visitation, over night)-none  (Concerns raised by the patient)- none , pt had understanding of ask me 3  (SI/HI/AVH)-denies

## 2024-08-15 NOTE — Plan of Care (Signed)
   Problem: Education: Goal: Emotional status will improve Outcome: Progressing Goal: Mental status will improve Outcome: Progressing

## 2024-08-15 NOTE — Progress Notes (Signed)
 [facility] Inpatient Psychiatry Progress Note  Date: 08/15/2024 Patient: Matthew Roach MRN: [id number]   Subjective  Matthew Roach is a 38 y.o. male  with a past psychiatric history of bipolar disorder with psychotic features, opioid use disorder, anxiety disorder. Patient initially arrived to [facility] on 9/14 for chest pain, and admitted to [facility] under IVC on 9/15 for stabilization of acute on chronic psychiatric conditions. PMHx is significant for seizures.   Psychiatric History: Previous psychiatric diagnoses: Polar disorder with psychotic features, opioid use disorder, anxiety disorder, seizures Prior psychiatric treatment: Lithium , Prozac , Remeron, Suboxone, diazepam , trazodone , olanzapine , methadone  Psychiatric medication compliance history: Noncompliant   Current psychiatric treatment: Diazepam , lithium , trazodone , olanzapine , methadone  Current psychiatrist: PCP Dr. Kennyth, psychiatric management post [facility] hospitalization has been handled by PCP  Current therapist: Denies   Previous hospitalizations: 02/2024: [facility] History of suicide attempts: None History of self harm: None  Interval Update  08/15/24  No significant events overnight. MAR was reviewed and patient was compliant with medications yesterday. Case was discussed in the multidisciplinary team.   On interview patient presents more organized. He clearly articulates his concern for coming off his prozac  and would like to be back on to help with his depression. He articulates the risks and benefits of the addition of prozac . He does not bring up any paranoid ideas or delusions, and expresses being somewhat tired today. Given that he is on 2 antipsychotics and lithium , we are willing to restart him on prozac  at this time even with the bipolar diagnosis.   Denies SI, HI, A&VH.  Objective  Vitals: Blood pressure 126/77, pulse 81, temperature 97.8 F (36.6 C), temperature source Oral, resp. rate  18, height 6' (1.829 m), weight 83.9 kg, SpO2 100%.   Physical Examination:  Physical Exam Vitals and nursing note reviewed. Exam conducted with a chaperone present.  Constitutional:      General: He is not in acute distress.    Appearance: He is normal weight.  Pulmonary:     Effort: Pulmonary effort is normal. No respiratory distress.  Skin:    General: Skin is warm and dry.  Neurological:     Mental Status: He is alert.     Gait: Gait normal.    Review of Systems  Constitutional: Negative.  Negative for fever.  Respiratory: Negative.  Negative for shortness of breath.   Cardiovascular:  Positive for chest pain. Negative for palpitations and leg swelling.  Gastrointestinal: Negative.  Negative for constipation and diarrhea.  Neurological:  Negative for dizziness.     Musculoskeletal:  Strength & Muscle Tone: within normal limits Gait & Station: normal   Mental Status Exam  Apperance: Appropriate for environment, Casual, Sitting upright, and Standing Behavior: Calm Speech: Normal Rate, Articulate, Normal Volume, and Responsive Attitude: Cooperative and Friendly Mood: depressed Affect: ANXIOUS, RESTRICTED, and Mood Congruent Perception: Not responding to internal stimuli Thought Content: within normal limits Thought Form: Goal Directed, Organized, Linear, and Logical Cognition: Alert & Oriented to person, place, and time, Recent and Remote memory grossly intact by recounting personal history, and Immediate memory grossly intact by interview Judgment: POOR Insight: POOR   Key Points: Denies SI, HI, A&VH     Lab Results:   No visits with results within 1 Day(s) from this visit.  Latest known visit with results is:  Admission on 08/04/2024, Discharged on 08/05/2024  Component Date Value Ref Range Status   Sodium 08/04/2024 142  135 - 145 mmol/L Final   Potassium 08/04/2024  4.1  3.5 - 5.1 mmol/L Final   Chloride 08/04/2024 102  98 - 111 mmol/L Final   CO2  08/04/2024 24  22 - 32 mmol/L Final   Glucose, Bld 08/04/2024 105 (H)  70 - 99 mg/dL Final   BUN 90/85/7974 20  6 - 20 mg/dL Final   Creatinine, Ser 08/04/2024 0.82  0.61 - 1.24 mg/dL Final   Calcium 90/85/7974 9.6  8.9 - 10.3 mg/dL Final   Total Protein 90/85/7974 7.6  6.5 - 8.1 g/dL Final   Albumin 90/85/7974 4.8  3.5 - 5.0 g/dL Final   AST 90/85/7974 24  15 - 41 U/L Final   ALT 08/04/2024 17  0 - 44 U/L Final   Alkaline Phosphatase 08/04/2024 68  38 - 126 U/L Final   Total Bilirubin 08/04/2024 0.3  0.0 - 1.2 mg/dL Final   GFR, Estimated 08/04/2024 >60  >60 mL/min Final   Anion gap 08/04/2024 16 (H)  5 - 15 Final   WBC 08/04/2024 9.5  4.0 - 10.5 K/uL Final   RBC 08/04/2024 4.99  4.22 - 5.81 MIL/uL Final   Hemoglobin 08/04/2024 12.7 (L)  13.0 - 17.0 g/dL Final   HCT 90/85/7974 41.0  39.0 - 52.0 % Final   MCV 08/04/2024 82.2  80.0 - 100.0 fL Final   MCH 08/04/2024 25.5 (L)  26.0 - 34.0 pg Final   MCHC 08/04/2024 31.0  30.0 - 36.0 g/dL Final   RDW 90/85/7974 15.0  11.5 - 15.5 % Final   Platelets 08/04/2024 298  150 - 400 K/uL Final   nRBC 08/04/2024 0.0  0.0 - 0.2 % Final   Neutrophils Relative % 08/04/2024 72  % Final   Neutro Abs 08/04/2024 6.8  1.7 - 7.7 K/uL Final   Lymphocytes Relative 08/04/2024 22  % Final   Lymphs Abs 08/04/2024 2.1  0.7 - 4.0 K/uL Final   Monocytes Relative 08/04/2024 5  % Final   Monocytes Absolute 08/04/2024 0.5  0.1 - 1.0 K/uL Final   Eosinophils Relative 08/04/2024 0  % Final   Eosinophils Absolute 08/04/2024 0.0  0.0 - 0.5 K/uL Final   Basophils Relative 08/04/2024 1  % Final   Basophils Absolute 08/04/2024 0.1  0.0 - 0.1 K/uL Final   Immature Granulocytes 08/04/2024 0  % Final   Abs Immature Granulocytes 08/04/2024 0.03  0.00 - 0.07 K/uL Final   Alcohol, Ethyl (B) 08/04/2024 <15  <15 mg/dL Final   Acetaminophen  (Tylenol ), Serum 08/04/2024 <10 (L)  10 - 30 ug/mL Final   Salicylate Lvl 08/04/2024 <7.0 (L)  7.0 - 30.0 mg/dL Final   Troponin T High  Sensitivity 08/04/2024 <15  0 - 19 ng/L Final   Troponin T High Sensitivity 08/04/2024 <15  0 - 19 ng/L Final   Opiates 08/05/2024 NEGATIVE  NEGATIVE Final   Cocaine 08/05/2024 NEGATIVE  NEGATIVE Final   Benzodiazepines 08/05/2024 POSITIVE (A)  NEGATIVE Final   Amphetamines 08/05/2024 NEGATIVE  NEGATIVE Final   Tetrahydrocannabinol 08/05/2024 POSITIVE (A)  NEGATIVE Final   Barbiturates 08/05/2024 NEGATIVE  NEGATIVE Final   Methadone  Scn, Ur 08/05/2024 POSITIVE (A)  NEGATIVE Final   Fentanyl  08/05/2024 NEGATIVE  NEGATIVE Final      Assessment & Plan  Principal Problem:   Sedative, hypnotic or anxiolytic use disorder, severe, in controlled environment [facility]) Active Problems:   Bipolar 1 disorder (HCC)   Delirium, drug-induced   Matthew Roach is a 38 y.o. male  with a past psychiatric history of bipolar disorder with psychotic  features, opioid use disorder, anxiety disorder. Patient initially arrived to [facility] on 9/14 for chest pain, and admitted to [facility] under IVC on 9/15 for stabilization of acute on chronic psychiatric conditions. PMHx is significant for seizures.    Patient presentation continues to be odd.  There is underlying paranoia about where he is leading to medication noncompliance.  The confused picture is not consistent with primary psychiatric diagnoses.  May be a substance-induced delirium, odd presentation for disorganized thought, a catatonic picture, or something else.  Chart documentation has strong evidence for an underlying bipolar disorder, which may or may not be contributing to this encounter.  Plan is roughly to continue the Valium  to help prevent seizures from withdrawal, stop offending agents, continues methadone , and get him on an antipsychotic.  IVC should stay in place as he cannot care for himself in this state.  It is unpredictable whether or not the psychotic component could lead to danger to others.   # Delirium, drug-induced # Sedative, hypnotic or  anxiolytic-induced mood disorder, severe, in controlled environment - Olanzapine  5mg  in the morning, and continue Olanzapine  15mg  at night for anxiety and paranoia.                         Pt was stable on Olanzapine  15mg  nightly at discharge from [facility], and when asked if her prefer the Olanzapine  or Seroquel , he said he had no preferrence and would be good to try the Olanzapine  again.                         QTcB , Lipid panel somewhat elevated in April. A1c nonconcerning in April  - Haldol  5mg  Q12 hours (8am, 8pm) for paranoia  - Increase Klonopin  0.5mg  daily to BID for anxiety for a few days  - Haldol  5mg  IM as a BACKUP if oral Olanzapine  is not taken - Methadone  110mg  daily             - IVC for concern of self-neglect from serious confusion putting pt at non-suicidal danger of harm to self                         - Submitted 9/16, due 9/23   - Finished Ativan  taper on 9/20.    - Restart Prozac  20mg  daily                         Pt on anti-manic medications             - Stopped Seroquel  50mg  nightly                         Replaced with Olanzapine     Psychiatry General PRNs             -- Trazodone  50 mg once nightly as needed for insomnia             -- Hydroxyzine  25 mg 3 times daily as needed for anxiety             -- Agitation protocol: Haldol , Benadryl , lorazepam     Medical Issues Being Addressed:  #none   Medical General PRNs             - Tylenol  tablets 650 mg every 6 hours as needed for pain - Maalox/Mylanta suspension 30 mL every 4 hours as needed  for indigestion - Milk of Magnesia 30 mL daily as needed for constipation    Discharge Planning:              -- Social work and case management to assist with discharge planning and identification of hospital follow-up needs prior to discharge             -- Estimated LOS: 7-14 days              -- Discharge Concerns: Need to establish a safety plan; Medication compliance and effectiveness             -- Discharge  Goals: Return home with outpatient referrals for mental health follow-up including medication management/psychotherapy  D. Penne Mori Psychiatry  PGY-1 08/15/2024, 10:53 AM

## 2024-08-15 NOTE — Group Note (Signed)
 Recreation Therapy Group Note   Group Topic:Self-Esteem  Group Date: 08/15/2024 Start Time: 1015 End Time: 1050 Facilitators: Rohini Jaroszewski-McCall, LRT,CTRS Location: [address] Day[address]   Group Topic: Self Esteem    Goal Area(s) Addresses:  Patient will appropriately identify what self esteem is.  Patient will create a shield of armor describing themselves.  Patient will successfully identify positive attributes about themselves.  Patient will acknowledge benefit of improved self-esteem.    Behavioral Response: Active   Intervention / Activity: Self-Esteem Shield. Patient attended a recreation therapy group session focused on self esteem. Patient identified what self esteem is, and why it is important to have high self esteem during group discussion. LRT were given a sheet of paper with what to put in each quadrant of the shield. Patients were also given an outline of a shield as well.  Patient was asked to create their own shield to show off their unique attributes, four quadrants reflected the following:    The Upper Left quadrant- 2 things or people you value The Upper Right quadrant- 2 lessons you have learned thus far in life The Lower Left quadrant- 3 qualities that make you unique The Lower Right quadrant- 1 goal you want to work towards   Patients were provided sheets with the shield printed on them and colored pencils, markers and crayons to complete the activity.  Patients and writer had group related discussions while individually working on their activity.    Education: Self esteem, Communication, Positive self-talk, Discharge Planning    Education Outcome: Acknowledges education/Verbalizes understanding of Education/In group clarification offered/Needs further education   Affect/Mood: Flat   Participation Level: Active   Participation Quality: Independent   Behavior: Appropriate   Speech/Thought Process: Focused   Insight: Moderate   Judgement: Moderate    Modes of Intervention: Art and Music   Patient Response to Interventions:  Receptive   Education Outcome:  In group clarification offered    Clinical Observations/Individualized Feedback: Pt was quiet and a little reserved. Pt did complete the assignment. Pt identified his family and living a simple life are what he values. Pt expressed don't take life for granted as a lesson he has learned  thus far in life. Pt explained giving people the same respect they give him as what makes him unique and lastly a goal he in working towards is accountability.     Plan: Continue to engage patient in RT group sessions 2-3x/week.   Joniqua Sidle-McCall, LRT,CTRS 08/15/2024 12:22 PM

## 2024-08-15 NOTE — BHH Group Notes (Signed)
 Adult Psychoeducational Group Note  Date:  08/15/2024 Time:  8:39 AM  Group Topic/Focus:  Goals Group:   The focus of this group is to help patients establish daily goals to achieve during treatment and discuss how the patient can incorporate goal setting into their daily lives to aide in recovery. Orientation:   The focus of this group is to educate the patient on the purpose and policies of crisis stabilization and provide a format to answer questions about their admission.  The group details unit policies and expectations of patients while admitted.  Participation Level:  Active  Participation Quality:  Appropriate  Affect:  Appropriate  Cognitive:  Alert  Insight: Appropriate  Engagement in Group:  Engaged  Modes of Intervention:  Discussion  Additional Comments:  Patient attended and participated in the goals group.  Matthew Roach 08/15/2024, 8:39 AM

## 2024-08-15 NOTE — Progress Notes (Signed)
   08/15/24 1600  Psych Admission Type (Psych Patients Only)  Admission Status Involuntary  Psychosocial Assessment  Patient Complaints None  Eye Contact Fair  Facial Expression Flat  Affect Depressed  Speech Logical/coherent  Interaction Assertive  Motor Activity Slow  Appearance/Hygiene Unremarkable  Behavior Characteristics Cooperative  Mood Anxious  Thought Process  Coherency WDL  Content WDL  Delusions None reported or observed  Perception WDL  Hallucination None reported or observed  Judgment Poor  Confusion None  Danger to Self  Current suicidal ideation? Denies  Danger to Others  Danger to Others None reported or observed   DAR NOTE: Patient presents with a flat affect and depressed mood.  Denies suicidal thoughts, auditory and visual hallucinations.  Rates depression at 5, hopelessness at 1, and anxiety at 8.  Maintained on routine safety checks.  Medications given as prescribed.  Support and encouragement offered as needed.  Attended group and participated.  States goal for today is take meds and solve my anxiety.  Patient visible in milieu with minimal interaction.  Offered no complaint.

## 2024-08-15 NOTE — Plan of Care (Signed)
   Problem: Education: Goal: Emotional status will improve Outcome: Progressing Goal: Mental status will improve Outcome: Progressing   Problem: Activity: Goal: Interest or engagement in activities will improve Outcome: Progressing Goal: Sleeping patterns will improve Outcome: Progressing   Problem: Safety: Goal: Periods of time without injury will increase Outcome: Progressing

## 2024-08-15 NOTE — Group Note (Signed)
 Occupational Therapy Group Note  Group Topic: Sleep Hygiene  Group Date: 08/15/2024 Start Time: 1531 End Time: 1601 Facilitators: Dot [place] MATSU, OT   Group Description: Group encouraged increased participation and engagement through topic focused on sleep hygiene. Patients reflected on the quality of sleep they typically receive and identified areas that need improvement. Group was given background information on sleep and sleep hygiene, including common sleep disorders. Group members also received information on how to improve one's sleep and introduced a sleep diary as a tool that can be utilized to track sleep quality over a length of time. Group session ended with patients identifying one or more strategies they could utilize or implement into their sleep routine in order to improve overall sleep quality.        Therapeutic Goal(s):  Identify one or more strategies to improve overall sleep hygiene  Identify one or more areas of sleep that are negatively impacted (sleep too much, too little, etc)     Participation Level: Minimal   Participation Quality: Independent   Behavior: Calm and Cooperative   Speech/Thought Process: Loose association    Affect/Mood: Appropriate   Insight: Limited   Judgement: Limited      Modes of Intervention: Education  Patient Response to Interventions:  Attentive   Plan: Continue to engage patient in OT groups 2 - 3x/week.  08/15/2024  [place] MATSU Dot, OT Maddilyn Campus, OT

## 2024-08-15 NOTE — Group Note (Signed)
 Date:  08/15/2024 Time:  8:47 PM  Group Topic/Focus:  Wrap-Up Group:   The focus of this group is to help patients review their daily goal of treatment and discuss progress on daily workbooks.    Participation Level:  Active  Participation Quality:  Appropriate  Affect:  Appropriate  Cognitive:  Appropriate  Insight: Appropriate  Engagement in Group:  Engaged  Modes of Intervention:  Education and Exploration  Additional Comments:  Patient attended and participated in group tonight.  He reports that his goal for today was to do what is expected of him by attending and participating in groups, taking his medication.  Vivek Grealish Dacosta 08/15/2024, 8:47 PM

## 2024-08-15 NOTE — Progress Notes (Signed)
(  Sleep Hours) -8.5 (Any PRNs that were needed, meds refused, or side effects to meds)-  vistaril  (Any disturbances and when (visitation, over night)- n/a (Concerns raised by the patient)- none (SI/HI/AVH)- denies

## 2024-08-16 ENCOUNTER — Encounter (HOSPITAL_COMMUNITY): Payer: Self-pay

## 2024-08-16 LAB — LITHIUM LEVEL: Lithium Lvl: 0.5 mmol/L — ABNORMAL LOW (ref 0.60–1.20)

## 2024-08-16 NOTE — Group Note (Signed)
 Recreation Therapy Group Note   Group Topic:Leisure Education  Group Date: 08/16/2024 Start Time: 1010 End Time: 1050 Facilitators: Isack Lavalley-McCall, LRT,CTRS Location: [address] Dayroom   Group Topic: Leisure Education  Goal Area(s) Addresses:  Patient will identify positive leisure activities.  Patient will identify one positive benefit of participation in leisure activities.   Behavioral Response: Active  Intervention: Leisure Group Game  Activity: Patient, MHT, and LRT participated in playing a trivia game of Guess the [place]. Participants took turns flicking the spinner. Whatever category (R&B, Hip Hop, Pop, Indie, Dance and Rock) the spinner landed on, the person would be read the lyrics and the person would have guess the missing lyric. The person with the most cards at the end of game won.  Education:  Leisure Exposure, Pharmacist, community, Discharge Planning  Education Outcome: Acknowledges education/In group clarification offered/Needs additional education   Affect/Mood: Appropriate   Participation Level: Active   Participation Quality: Independent   Behavior: Attentive    Speech/Thought Process: Focused   Insight: Good   Judgement: Good   Modes of Intervention: Competitive Play   Patient Response to Interventions:  Engaged   Education Outcome:  In group clarification offered    Clinical Observations/Individualized Feedback: Pt was appropriate and attentive during group. Pt was focused and was able to guess some of the songs correctly.     Plan: Continue to engage patient in RT group sessions 2-3x/week.   Mayson Sterbenz-McCall, LRT,CTRS  08/16/2024 1:39 PM

## 2024-08-16 NOTE — Progress Notes (Signed)
 CSW obtained consent from patient to contact family members regarding his current admission and discharge planning. Patient signed ROI and CSW placed in folder.  Latroya Ng, LCSWA 08/16/24 1:45PM

## 2024-08-16 NOTE — BH IP Treatment Plan (Signed)
 Interdisciplinary Treatment and Diagnostic Plan Update  08/16/2024 Time of Session: 12:35 PM - Matthew Roach MRN: [id number]  Principal Diagnosis: Sedative, hypnotic or anxiolytic use disorder, severe, in controlled environment (HCC)  Secondary Diagnoses: Principal Problem:   Sedative, hypnotic or anxiolytic use disorder, severe, in controlled environment [facility]) Active Problems:   Bipolar 1 disorder (HCC)   Delirium, drug-induced   Current Medications:  Current Facility-Administered Medications  Medication Dose Route Frequency Provider Last Rate Last Admin   acetaminophen  (TYLENOL ) tablet 650 mg  650 mg Oral Q6H PRN Motley-Mangrum, Jadeka A, PMHNP       alum & mag hydroxide-simeth (MAALOX/MYLANTA) 200-200-20 MG/5ML suspension 30 mL  30 mL Oral Q4H PRN Motley-Mangrum, Jadeka A, PMHNP       clonazePAM  (KLONOPIN ) tablet 0.5 mg  0.5 mg Oral BID Prunty, Donald B, DO   0.5 mg at 08/15/24 1710   haloperidol  (HALDOL ) tablet 5 mg  5 mg Oral TID PRN Motley-Mangrum, Jadeka A, PMHNP       And   diphenhydrAMINE  (BENADRYL ) capsule 50 mg  50 mg Oral TID PRN Motley-Mangrum, Jadeka A, PMHNP       haloperidol  lactate (HALDOL ) injection 5 mg  5 mg Intramuscular TID PRN Motley-Mangrum, Jadeka A, PMHNP       And   diphenhydrAMINE  (BENADRYL ) injection 50 mg  50 mg Intramuscular TID PRN Motley-Mangrum, Jadeka A, PMHNP       And   LORazepam  (ATIVAN ) injection 2 mg  2 mg Intramuscular TID PRN Motley-Mangrum, Jadeka A, PMHNP       haloperidol  lactate (HALDOL ) injection 10 mg  10 mg Intramuscular TID PRN Motley-Mangrum, Jadeka A, PMHNP       And   diphenhydrAMINE  (BENADRYL ) injection 50 mg  50 mg Intramuscular TID PRN Motley-Mangrum, Jadeka A, PMHNP       And   LORazepam  (ATIVAN ) injection 2 mg  2 mg Intramuscular TID PRN Motley-Mangrum, Jadeka A, PMHNP       FLUoxetine  (PROZAC ) capsule 20 mg  20 mg Oral Daily Prunty, Donald B, DO   20 mg at 08/15/24 0847   haloperidol  (HALDOL ) tablet 5 mg  5 mg Oral  Q12H Prunty, Donald B, DO   5 mg at 08/15/24 2026   hydrOXYzine  (ATARAX ) tablet 25 mg  25 mg Oral TID PRN Motley-Mangrum, Jadeka A, PMHNP   25 mg at 08/15/24 2026   influenza vac split trivalent PF (FLUZONE ) injection 0.5 mL  0.5 mL Intramuscular Tomorrow-1000 Bobbitt, Shalon E, NP       lithium  carbonate (LITHOBID ) ER tablet 300 mg  300 mg Oral Q12H Prunty, Donald B, DO   300 mg at 08/15/24 2026   loperamide  (IMODIUM ) capsule 2 mg  2 mg Oral TID PRN Butler, Laura N, MD       magnesium  hydroxide (MILK OF MAGNESIA) suspension 30 mL  30 mL Oral Daily PRN Motley-Mangrum, Jadeka A, PMHNP       methadone  (DOLOPHINE ) tablet 110 mg  110 mg Oral Daily Towana Leita SAILOR, MD   110 mg at 08/15/24 0801   multivitamin with minerals tablet 1 tablet  1 tablet Oral Daily Pashayan, Alexander S, DO   1 tablet at 08/11/24 0750   nicotine  polacrilex (NICORETTE ) gum 2 mg  2 mg Oral PRN Towana Leita SAILOR, MD   2 mg at 08/15/24 2027   OLANZapine  (ZYPREXA ) tablet 15 mg  15 mg Oral QHS Lera Golas B, DO   15 mg at 08/15/24 2026   OLANZapine  (ZYPREXA ) tablet  5 mg  5 mg Oral Daily Prunty, Donald B, DO   5 mg at 08/15/24 0800   thiamine  (Vitamin B-1) tablet 100 mg  100 mg Oral Daily Pashayan, Alexander S, DO   100 mg at 08/10/24 9243   thiamine  (VITAMIN B1) injection 100 mg  100 mg Intramuscular Once Pashayan, Alexander S, DO       traZODone  (DESYREL ) tablet 100 mg  100 mg Oral QHS PRN Trudy Carwin, NP   100 mg at 08/15/24 2026   PTA Medications: Medications Prior to Admission  Medication Sig Dispense Refill Last Dose/Taking   clonazePAM  (KLONOPIN ) 1 MG tablet Take 1 mg by mouth 2 (two) times daily. (Patient not taking: Reported on 08/04/2024)      diazepam  (VALIUM ) 10 MG tablet Take 2 tablets (20 mg total) by mouth every 12 (twelve) hours as needed for anxiety. 120 tablet 5    FLUoxetine  (PROZAC ) 20 MG capsule Take 1 capsule (20 mg total) by mouth daily. 90 capsule 1    lithium  carbonate (ESKALITH ) 450 MG ER tablet Take  450 mg by mouth 2 (two) times daily. (Patient not taking: Reported on 08/04/2024)      methadone  (DOLOPHINE ) 10 MG/ML solution Take 125 mg by mouth daily.      mirtazapine (REMERON) 15 MG tablet Take 15 mg by mouth at bedtime. (Patient not taking: Reported on 08/04/2024)      OLANZapine  (ZYPREXA ) 2.5 MG tablet Take 2.5 mg by mouth at bedtime. (Patient not taking: Reported on 08/04/2024)      QUEtiapine  (SEROQUEL ) 50 MG tablet TAKE 1 TABLET BY MOUTH EVERYDAY AT BEDTIME 90 tablet 1    traZODone  (DESYREL ) 50 MG tablet Take 50 mg by mouth at bedtime. (Patient not taking: Reported on 08/04/2024)       Patient Stressors: Other: I don't know    Patient Strengths: Other: Respectful  Treatment Modalities: Medication Management, Group therapy, Case management,  1 to 1 session with clinician, Psychoeducation, Recreational therapy.   Physician Treatment Plan for Primary Diagnosis: Sedative, hypnotic or anxiolytic use disorder, severe, in controlled environment (HCC) Long Term Goal(s):     Short Term Goals:    Medication Management: Evaluate patient's response, side effects, and tolerance of medication regimen.  Therapeutic Interventions: 1 to 1 sessions, Unit Group sessions and Medication administration.  Evaluation of Outcomes: Progressing  Physician Treatment Plan for Secondary Diagnosis: Principal Problem:   Sedative, hypnotic or anxiolytic use disorder, severe, in controlled environment (HCC) Active Problems:   Bipolar 1 disorder (HCC)   Delirium, drug-induced  Long Term Goal(s):     Short Term Goals:       Medication Management: Evaluate patient's response, side effects, and tolerance of medication regimen.  Therapeutic Interventions: 1 to 1 sessions, Unit Group sessions and Medication administration.  Evaluation of Outcomes: Progressing   RN Treatment Plan for Primary Diagnosis: Sedative, hypnotic or anxiolytic use disorder, severe, in controlled environment (HCC) Long Term  Goal(s): Knowledge of disease and therapeutic regimen to maintain health will improve  Short Term Goals: Ability to remain free from injury will improve, Ability to verbalize frustration and anger appropriately will improve, Ability to verbalize feelings will improve, and Ability to disclose and discuss suicidal ideas  Medication Management: RN will administer medications as ordered by provider, will assess and evaluate patient's response and provide education to patient for prescribed medication. RN will report any adverse and/or side effects to prescribing provider.  Therapeutic Interventions: 1 on 1 counseling sessions, Psychoeducation, Medication administration, Evaluate  responses to treatment, Monitor vital signs and CBGs as ordered, Perform/monitor CIWA, COWS, AIMS and Fall Risk screenings as ordered, Perform wound care treatments as ordered.  Evaluation of Outcomes: Progressing   LCSW Treatment Plan for Primary Diagnosis: Sedative, hypnotic or anxiolytic use disorder, severe, in controlled environment [facility]) Long Term Goal(s): Safe transition to appropriate next level of care at discharge, Engage patient in therapeutic group addressing interpersonal concerns.  Short Term Goals: Engage patient in aftercare planning with referrals and resources, Increase ability to appropriately verbalize feelings, Facilitate acceptance of mental health diagnosis and concerns, and Identify triggers associated with mental health/substance abuse issues  Therapeutic Interventions: Assess for all discharge needs, 1 to 1 time with Social worker, Explore available resources and support systems, Assess for adequacy in community support network, Educate family and significant other(s) on suicide prevention, Complete Psychosocial Assessment, Interpersonal group therapy.  Evaluation of Outcomes: Progressing   Progress in Treatment: Attending groups: Yes Participating in groups: Yes Taking medication as prescribed:   Yes. Toleration medication: Yes. Family/Significant other contact made: consents are pending (patient appears to be confused and was unable to provide consent) Patient understands diagnosis: No. Discussing patient identified problems/goals with staff: No. Medical problems stabilized or resolved: Yes. Denies suicidal/homicidal ideation: Yes. Issues/concerns per patient self-inventory: No.   New problem(s) identified:  No   New Short Term/Long Term Goal(s):     medication stabilization, elimination of SI thoughts, development of comprehensive mental wellness plan.      Patient Goals:  I'm not sure.  My memory is gone.    Discharge Plan or Barriers:  Patient recently admitted. CSW will continue to follow and assess for appropriate referrals and possible discharge planning.      Reason for Continuation of Hospitalization: Medication stabilization  Psychosis   Estimated Length of Stay:  4 - 6 days  Last 3 [place] Suicide Severity Risk Score: Flowsheet Row Ad[facility] (Current) [facility] from 01/24/2024 in [facility] Department [facility] ED from 01/22/2024 in [facility] Emergency Department at [facility]  C-SSRS RISK CATEGORY No Risk No Risk No Risk    Last PHQ 2/9 Scores:    07/10/2024    9:43 AM 06/24/2024    2:00 PM 12/21/2023   11:50 AM  Depression screen PHQ 2/9  Decreased Interest 2 3 0  Down, Depressed, Hopeless 2 2 1   PHQ - 2 Score 4 5 1   Altered sleeping 2 3 0  Tired, decreased energy 2 3 1   Change in appetite 2 3 2   Feeling bad or failure about yourself  2 1 0  Trouble concentrating 2 2 0  Moving slowly or fidgety/restless 2 2 0  Suicidal thoughts 2 0 0  PHQ-9 Score 18 19 4   Difficult doing work/chores  Extremely dIfficult Somewhat difficult    Scribe for Treatment Team: Shyanne Mcclary O Semir Brill, LCSWA 08/16/2024 8:28 AM

## 2024-08-16 NOTE — BHH Group Notes (Signed)
 Adult Psychoeducational Group Note  Date:  08/16/2024 Time:  7:42 PM  Group Topic/Focus:  Goals Group:   The focus of this group is to help patients establish daily goals to achieve during treatment and discuss how the patient can incorporate goal setting into their daily lives to aide in recovery. Orientation:   The focus of this group is to educate the patient on the purpose and policies of crisis stabilization and provide a format to answer questions about their admission.  The group details unit policies and expectations of patients while admitted.  Participation Level:  Did Not Attend  Participation Quality:    Affect:    Cognitive:    Insight:   Engagement in Group:    Modes of Intervention:    Additional Comments:    Jahron Hunsinger O 08/16/2024, 7:42 PM

## 2024-08-16 NOTE — Plan of Care (Signed)
   Problem: Education: Goal: Emotional status will improve Outcome: Progressing Goal: Mental status will improve Outcome: Progressing

## 2024-08-16 NOTE — Plan of Care (Signed)
   Problem: Education: Goal: Emotional status will improve Outcome: Progressing Goal: Mental status will improve Outcome: Progressing   Problem: Activity: Goal: Interest or engagement in activities will improve Outcome: Progressing Goal: Sleeping patterns will improve Outcome: Progressing

## 2024-08-16 NOTE — Progress Notes (Signed)
   08/15/24 2045  Psych Admission Type (Psych Patients Only)  Admission Status Involuntary  Psychosocial Assessment  Patient Complaints None  Eye Contact Fair  Facial Expression Flat  Affect Depressed  Speech Logical/coherent  Interaction Assertive  Motor Activity Slow  Appearance/Hygiene Unremarkable  Behavior Characteristics Cooperative  Mood Anxious  Aggressive Behavior  Effect No apparent injury  Thought Process  Coherency WDL  Content WDL  Delusions WDL  Perception WDL  Hallucination None reported or observed  Judgment Poor  Confusion WDL  Danger to Self  Current suicidal ideation? Denies  Danger to Others  Danger to Others None reported or observed

## 2024-08-16 NOTE — Progress Notes (Signed)
 [facility] Inpatient Psychiatry Progress Note  Date: 08/16/2024 Patient: Matthew Roach MRN: [id number]   Subjective  Matthew Roach is a 38 y.o. male  with a past psychiatric history of bipolar disorder with psychotic features, opioid use disorder, anxiety disorder. Patient initially arrived to [facility] on 9/14 for chest pain, and admitted to [facility] under IVC on 9/15 for stabilization of acute on chronic psychiatric conditions. PMHx is significant for seizures.   Psychiatric History: Previous psychiatric diagnoses: Polar disorder with psychotic features, opioid use disorder, anxiety disorder, seizures Prior psychiatric treatment: Lithium , Prozac , Remeron, Suboxone, diazepam , trazodone , olanzapine , methadone  Psychiatric medication compliance history: Noncompliant   Current psychiatric treatment: Diazepam , lithium , trazodone , olanzapine , methadone  Current psychiatrist: PCP Dr. Kennyth, psychiatric management post [facility] hospitalization has been handled by PCP  Current therapist: Denies   Previous hospitalizations: 02/2024: [facility] History of suicide attempts: None History of self harm: None  Interval Update  08/16/24  No significant events overnight. MAR was reviewed and patient was compliant with medications yesterday. Case was discussed in the multidisciplinary team.   On interview patient presents more organized. He still has anxiety, and has concern for his credit score as he has not been able to pay his bills while hospitalized and family has not been able to pay bills for him because of the face-recognition software used for password protection. No overt paranoia or delusions today. Continuing to monitor anxiety.  Denies SI, HI, A&VH.  Objective  Vitals: Blood pressure 112/63, pulse 84, temperature (!) 97.3 F (36.3 C), temperature source Oral, resp. rate 18, height 6' (1.829 m), weight 83.9 kg, SpO2 100%.   Physical Examination:  Physical Exam Vitals  and nursing note reviewed. Exam conducted with a chaperone present.  Constitutional:      General: He is not in acute distress.    Appearance: He is normal weight.  Pulmonary:     Effort: Pulmonary effort is normal. No respiratory distress.  Skin:    General: Skin is warm and dry.  Neurological:     Mental Status: He is alert.     Gait: Gait normal.    Review of Systems  Constitutional: Negative.  Negative for fever.  Respiratory: Negative.  Negative for shortness of breath.   Cardiovascular:  Positive for chest pain. Negative for palpitations and leg swelling.  Gastrointestinal: Negative.  Negative for constipation and diarrhea.  Neurological:  Negative for dizziness.     Musculoskeletal:  Strength & Muscle Tone: within normal limits Gait & Station: normal   Mental Status Exam  Apperance: Appropriate for environment, Casual, Sitting upright, and Standing Behavior: Calm Speech: Normal Rate, Articulate, Normal Volume, and Responsive Attitude: Cooperative and Friendly Mood: anxious Affect: ANXIOUS, RESTRICTED, and Mood Congruent Perception: Not responding to internal stimuli Thought Content: within normal limits Thought Form: Goal Directed, Organized, Linear, and Logical Cognition: Alert & Oriented to person, place, and time, Recent and Remote memory grossly intact by recounting personal history, and Immediate memory grossly intact by interview Judgment: POOR Insight: POOR   Key Points: Denies SI, HI, A&VH     Lab Results:   No visits with results within 1 Day(s) from this visit.  Latest known visit with results is:  Admission on 08/04/2024, Discharged on 08/05/2024  Component Date Value Ref Range Status   Sodium 08/04/2024 142  135 - 145 mmol/L Final   Potassium 08/04/2024 4.1  3.5 - 5.1 mmol/L Final   Chloride 08/04/2024 102  98 - 111 mmol/L Final   CO2  08/04/2024 24  22 - 32 mmol/L Final   Glucose, Bld 08/04/2024 105 (H)  70 - 99 mg/dL Final   BUN  90/85/7974 20  6 - 20 mg/dL Final   Creatinine, Ser 08/04/2024 0.82  0.61 - 1.24 mg/dL Final   Calcium 90/85/7974 9.6  8.9 - 10.3 mg/dL Final   Total Protein 90/85/7974 7.6  6.5 - 8.1 g/dL Final   Albumin 90/85/7974 4.8  3.5 - 5.0 g/dL Final   AST 90/85/7974 24  15 - 41 U/L Final   ALT 08/04/2024 17  0 - 44 U/L Final   Alkaline Phosphatase 08/04/2024 68  38 - 126 U/L Final   Total Bilirubin 08/04/2024 0.3  0.0 - 1.2 mg/dL Final   GFR, Estimated 08/04/2024 >60  >60 mL/min Final   Anion gap 08/04/2024 16 (H)  5 - 15 Final   WBC 08/04/2024 9.5  4.0 - 10.5 K/uL Final   RBC 08/04/2024 4.99  4.22 - 5.81 MIL/uL Final   Hemoglobin 08/04/2024 12.7 (L)  13.0 - 17.0 g/dL Final   HCT 90/85/7974 41.0  39.0 - 52.0 % Final   MCV 08/04/2024 82.2  80.0 - 100.0 fL Final   MCH 08/04/2024 25.5 (L)  26.0 - 34.0 pg Final   MCHC 08/04/2024 31.0  30.0 - 36.0 g/dL Final   RDW 90/85/7974 15.0  11.5 - 15.5 % Final   Platelets 08/04/2024 298  150 - 400 K/uL Final   nRBC 08/04/2024 0.0  0.0 - 0.2 % Final   Neutrophils Relative % 08/04/2024 72  % Final   Neutro Abs 08/04/2024 6.8  1.7 - 7.7 K/uL Final   Lymphocytes Relative 08/04/2024 22  % Final   Lymphs Abs 08/04/2024 2.1  0.7 - 4.0 K/uL Final   Monocytes Relative 08/04/2024 5  % Final   Monocytes Absolute 08/04/2024 0.5  0.1 - 1.0 K/uL Final   Eosinophils Relative 08/04/2024 0  % Final   Eosinophils Absolute 08/04/2024 0.0  0.0 - 0.5 K/uL Final   Basophils Relative 08/04/2024 1  % Final   Basophils Absolute 08/04/2024 0.1  0.0 - 0.1 K/uL Final   Immature Granulocytes 08/04/2024 0  % Final   Abs Immature Granulocytes 08/04/2024 0.03  0.00 - 0.07 K/uL Final   Alcohol, Ethyl (B) 08/04/2024 <15  <15 mg/dL Final   Acetaminophen  (Tylenol ), Serum 08/04/2024 <10 (L)  10 - 30 ug/mL Final   Salicylate Lvl 08/04/2024 <7.0 (L)  7.0 - 30.0 mg/dL Final   Troponin T High Sensitivity 08/04/2024 <15  0 - 19 ng/L Final   Troponin T High Sensitivity 08/04/2024 <15  0 - 19  ng/L Final   Opiates 08/05/2024 NEGATIVE  NEGATIVE Final   Cocaine 08/05/2024 NEGATIVE  NEGATIVE Final   Benzodiazepines 08/05/2024 POSITIVE (A)  NEGATIVE Final   Amphetamines 08/05/2024 NEGATIVE  NEGATIVE Final   Tetrahydrocannabinol 08/05/2024 POSITIVE (A)  NEGATIVE Final   Barbiturates 08/05/2024 NEGATIVE  NEGATIVE Final   Methadone  Scn, Ur 08/05/2024 POSITIVE (A)  NEGATIVE Final   Fentanyl  08/05/2024 NEGATIVE  NEGATIVE Final      Assessment & Plan  Principal Problem:   Sedative, hypnotic or anxiolytic use disorder, severe, in controlled environment (HCC) Active Problems:   Bipolar 1 disorder (HCC)   Delirium, drug-induced   Matthew Roach is a 38 y.o. male  with a past psychiatric history of bipolar disorder with psychotic features, opioid use disorder, anxiety disorder. Patient initially arrived to [facility] on 9/14 for chest pain, and admitted to [facility] under  IVC on 9/15 for stabilization of acute on chronic psychiatric conditions. PMHx is significant for seizures.    Patient presentation continues to be odd.  There is underlying paranoia about where he is leading to medication noncompliance.  The confused picture is not consistent with primary psychiatric diagnoses.  May be a substance-induced delirium, odd presentation for disorganized thought, a catatonic picture, or something else.  Chart documentation has strong evidence for an underlying bipolar disorder, which may or may not be contributing to this encounter.  Plan is roughly to continue the Valium  to help prevent seizures from withdrawal, stop offending agents, continues methadone , and get him on an antipsychotic.  IVC should stay in place as he cannot care for himself in this state.  It is unpredictable whether or not the psychotic component could lead to danger to others.   # Delirium, drug-induced # Sedative, hypnotic or anxiolytic-induced mood disorder, severe, in controlled environment - Olanzapine  5mg  in the morning, and  continue Olanzapine  15mg  at night for anxiety and paranoia.                         Pt was stable on Olanzapine  15mg  nightly at discharge from [facility], and when asked if her prefer the Olanzapine  or Seroquel , he said he had no preferrence and would be good to try the Olanzapine  again.                         QTcB , Lipid panel somewhat elevated in April. A1c nonconcerning in April  - Haldol  5mg  Q12 hours (8am, 8pm) for paranoia  - Klonopin  0.5mg  daily to BID for anxiety for a few days  - Haldol  5mg  IM as a BACKUP if oral Olanzapine  is not taken - Methadone  110mg  daily             - IVC for concern of self-neglect from serious confusion putting pt at non-suicidal danger of harm to self                         - Submitted 9/16, due 9/23   - Finished Ativan  taper on 9/20.    - Prozac  20mg  daily                         Pt on anti-manic medications             - Stopped Seroquel  50mg  nightly                         Replaced with Olanzapine     Psychiatry General PRNs             -- Trazodone  50 mg once nightly as needed for insomnia             -- Hydroxyzine  25 mg 3 times daily as needed for anxiety             -- Agitation protocol: Haldol , Benadryl , lorazepam     Medical Issues Being Addressed:  #none   Medical General PRNs             - Tylenol  tablets 650 mg every 6 hours as needed for pain - Maalox/Mylanta suspension 30 mL every 4 hours as needed for indigestion - Milk of Magnesia 30 mL daily as needed for constipation    Discharge Planning:              --  Social work and case management to assist with discharge planning and identification of hospital follow-up needs prior to discharge             -- Estimated LOS: 7-14 days              -- Discharge Concerns: Need to establish a safety plan; Medication compliance and effectiveness             -- Discharge Goals: Return home with outpatient referrals for mental health follow-up including medication  management/psychotherapy  D. Penne Mori Psychiatry  PGY-1 08/16/2024, 1:49 PM

## 2024-08-16 NOTE — Group Note (Signed)
 Date:  08/16/2024 Time:  8:49 PM  Group Topic/Focus:  Wrap-Up Group:   The focus of this group is to help patients review their daily goal of treatment and discuss progress on daily workbooks.    Participation Level:  Active  Participation Quality:  Appropriate  Affect:  Appropriate  Cognitive:  Appropriate  Insight: Appropriate  Engagement in Group:  Engaged  Modes of Intervention:  Education and Exploration  Additional Comments:  Patient attended and participated in group tonight. He reports he like that he is determined and he would improve on procraction.  Gwenn Chillington Dacosta 08/16/2024, 8:49 PM

## 2024-08-16 NOTE — Progress Notes (Signed)
(  Sleep Hours) -8  (Any PRNs that were needed, meds refused, or side effects to meds)- Vistaril  25 mg , Trazodone  100 mg  (Any disturbances and when (visitation, over night)-n/a  (Concerns raised by the patient)- none  (SI/HI/AVH)-denies

## 2024-08-17 MED ORDER — FLUOXETINE HCL 20 MG PO CAPS
30.0000 mg | ORAL_CAPSULE | Freq: Every day | ORAL | Status: DC
Start: 2024-08-18 — End: 2024-08-20
  Administered 2024-08-18 – 2024-08-20 (×3): 30 mg via ORAL
  Filled 2024-08-17 (×3): qty 1

## 2024-08-17 MED ORDER — SENNOSIDES-DOCUSATE SODIUM 8.6-50 MG PO TABS
2.0000 | ORAL_TABLET | Freq: Once | ORAL | Status: AC
  Administered 2024-08-17: 2 via ORAL
  Filled 2024-08-17: qty 2

## 2024-08-17 NOTE — B[facility] Group Notes (Signed)
 Adult Psychoeducational Group Note  Date:  08/17/2024 Time:  1:43 PM  Group Topic/Focus:  Goals Group:   The focus of this group is to help patients establish daily goals to achieve during treatment and discuss how the patient can incorporate goal setting into their daily lives to aide in recovery. Orientation:   The focus of this group is to educate the patient on the purpose and policies of crisis stabilization and provide a format to answer questions about their admission.  The group details unit policies and expectations of patients while admitted.  Participation Level:  Active  Participation Quality:  Appropriate  Affect:  Appropriate  Cognitive:  Appropriate  Insight: Appropriate  Engagement in Group:  Engaged  Modes of Intervention:  Discussion  Additional Comments:  Pt attended the goals group and remained appropriate and engaged throughout the duration of the group.   Angenette Daily O 08/17/2024, 1:43 PM

## 2024-08-17 NOTE — Group Note (Deleted)
 LCSW Group Therapy Note   Group Date: 08/17/2024 Start Time: 1040 End Time: 1140   Type of Therapy and Topic:  Group Therapy: Challenging Core Beliefs  Participation Level:  {BHH PARTICIPATION [id number]}  Description of Group:  Patients were educated about core beliefs and asked to identify one harmful core belief that they have. Patients were asked to explore from where those beliefs originate. Patients were asked to discuss how those beliefs make them feel and the resulting behaviors of those beliefs. They were then be asked if those beliefs are true and, if so, what evidence they have to support them. Lastly, group members were challenged to replace those negative core beliefs with helpful beliefs.   Therapeutic Goals:   1. Patient will identify harmful core beliefs and explore the origins of such beliefs. 2. Patient will identify feelings and behaviors that result from those core beliefs. 3. Patient will discuss whether such beliefs are true. 4.  Patient will replace harmful core beliefs with helpful ones.  Summary of Patient Progress:  *** actively engaged in processing and exploring how core beliefs are formed and how they impact thoughts, feelings, and behaviors. Patient proved open to input from peers and feedback from CSW. Patient demonstrated *** insight into the subject matter, was respectful and supportive of peers, and participated throughout the entire session.  Therapeutic Modalities: Cognitive Behavioral Therapy; Solution-Focused Therapy   [name] Lever, ISRAEL 08/17/2024  1:18 PM

## 2024-08-17 NOTE — Plan of Care (Signed)
   Problem: Activity: Goal: Interest or engagement in activities will improve Outcome: Progressing

## 2024-08-17 NOTE — Plan of Care (Signed)
   Problem: Education: Goal: Knowledge of [place] [facility] Education information/materials will improve Outcome: Progressing Goal: Emotional status will improve Outcome: Progressing Goal: Mental status will improve Outcome: Progressing

## 2024-08-17 NOTE — Group Note (Signed)
 [facility] LCSW Group Therapy Note    Group Date: 08/17/2024 Start Time: 1040 End Time: 1140  Type of Therapy and Topic:  Group Therapy:  Overcoming Obstacles with values and self-care routines.   Participation Level:  [facility] PARTICIPATION - Mild.  Mood: Appropriate   Description of Group:   In this group patients will be encouraged to explore what they see as obstacles to their own wellness and recovery. They will be guided to discuss their thoughts, feelings, and behaviors related to these obstacles. The group will process together ways to cope with barriers, with attention given to specific choices patients can make. Each patient will be challenged to identify changes they are motivated to make in order to overcome their obstacles. This group will be process-oriented, with patients participating in exploration of their own experiences as well as giving and receiving support and challenge from other group members.  Therapeutic Goals: 1. Patient will identify personal and current obstacles as they relate to admission. 2. Patient will identify barriers that currently interfere with their wellness or overcoming obstacles.  3. Patient will identify feelings, thought process and behaviors related to these barriers. 4. Patient will identify two changes they are willing to make to overcome these obstacles:    Summary of Patient Progress Patient shared during first half of group but slept for remainder.    Therapeutic Modalities:   Cognitive Behavioral Therapy Solution Focused Therapy Motivational Interviewing Relapse Prevention Therapy   Matthew Roach, LCSWA

## 2024-08-17 NOTE — Group Note (Signed)
 Date:  08/17/2024 Time:  9:29 PM  Group Topic/Focus:  Wrap-Up Group:   The focus of this group is to help patients review their daily goal of treatment and discuss progress on daily workbooks.    Participation Level:Active  Participation Quality:  Appropriate  Affect:  Appropriate  Cognitive:  Appropriate  Insight: Appropriate  Engagement in Group:  Engaged  Modes of Intervention:  Education and Exploration  Additional Comments:  Patient attended and participated in group tonight. He reports that today he went for his meals, went to the G[employer] and played basket ball. He is taking some new medication which makes him sleepy.  Today he spoke with his doctor and he believe they are on the same page. He did not have a visitor today, but has been sleeping well.  Gwenn Chillington Dacosta 08/17/2024, 9:29 PM

## 2024-08-17 NOTE — Progress Notes (Signed)
   08/17/24 1500  Psych Admission Type (Psych Patients Only)  Admission Status Involuntary  Psychosocial Assessment  Patient Complaints Anxiety  Eye Contact Fair  Facial Expression Flat  Affect Depressed  Speech Logical/coherent  Interaction Assertive  Motor Activity Slow  Appearance/Hygiene Unremarkable  Behavior Characteristics Cooperative  Mood Anxious  Thought Process  Coherency WDL  Content WDL  Delusions WDL  Perception WDL  Hallucination None reported or observed  Judgment Poor  Confusion WDL  Danger to Self  Current suicidal ideation? Denies  Description of Suicide Plan No Plan  Agreement Not to Harm Self Yes  Description of Agreement Verbal  Danger to Others  Danger to Others None reported or observed

## 2024-08-17 NOTE — Progress Notes (Signed)
 [facility] Inpatient Psychiatry Progress Note  Date: 08/17/2024 Patient: Matthew Roach MRN: [id number]   Subjective  Matthew Roach is a 38 y.o. male  with a past psychiatric history of bipolar disorder with psychotic features, opioid use disorder, anxiety disorder. Patient initially arrived to [facility] on 9/14 for chest pain, and admitted to [facility] under IVC on 9/15 for stabilization of acute on chronic psychiatric conditions. PMHx is significant for seizures.   Psychiatric History: Previous psychiatric diagnoses: Polar disorder with psychotic features, opioid use disorder, anxiety disorder, seizures Prior psychiatric treatment: Lithium , Prozac , Remeron, Suboxone, diazepam , trazodone , olanzapine , methadone  Psychiatric medication compliance history: Noncompliant   Current psychiatric treatment: Diazepam , lithium , trazodone , olanzapine , methadone  Current psychiatrist: PCP Dr. Kennyth, psychiatric management post [facility] hospitalization has been handled by PCP  Current therapist: Denies   Previous hospitalizations: 02/2024: [facility] History of suicide attempts: None History of self harm: None  Interval Update  08/17/24  No significant events overnight. MAR was reviewed and patient was compliant with medications yesterday. Case was discussed in the multidisciplinary team.   On interview patient presents more organized. He is however still having bothersome depression and anxiety. We discussed increasing his prozac  to 40mg , but pt has had poor reactions to Prozac  at 40mg  in the past, so we are increasing to 30mg  daily starting tomorrow. No overt paranoia or delusions today. Continuing to monitor anxiety. MSE unchanged.  Inreasing Prozac  to 30mg  9/27.  Denies SI, HI, A&VH.  Objective  Vitals: Blood pressure 106/76, pulse 89, temperature 97.6 F (36.4 C), temperature source Oral, resp. rate 16, height 6' (1.829 m), weight 83.9 kg, SpO2 99%.   Physical Examination:   Physical Exam Vitals and nursing note reviewed. Exam conducted with a chaperone present.  Constitutional:      General: He is not in acute distress.    Appearance: He is normal weight.  Pulmonary:     Effort: Pulmonary effort is normal. No respiratory distress.  Skin:    General: Skin is warm and dry.  Neurological:     Mental Status: He is alert.     Gait: Gait normal.    Review of Systems  Constitutional: Negative.  Negative for fever.  Respiratory: Negative.  Negative for shortness of breath.   Cardiovascular:  Positive for chest pain. Negative for palpitations and leg swelling.  Gastrointestinal: Negative.  Negative for constipation and diarrhea.  Neurological:  Negative for dizziness.     Musculoskeletal:  Strength & Muscle Tone: within normal limits Gait & Station: normal   Mental Status Exam  Apperance: Appropriate for environment, Casual, Sitting upright, and Standing Behavior: Calm Speech: Normal Rate, Articulate, Normal Volume, and Responsive Attitude: Cooperative and Friendly Mood: anxious Affect: ANXIOUS, RESTRICTED, and Mood Congruent Perception: Not responding to internal stimuli Thought Content: within normal limits Thought Form: Goal Directed, Organized, Linear, and Logical Cognition: Alert & Oriented to person, place, and time, Recent and Remote memory grossly intact by recounting personal history, and Immediate memory grossly intact by interview Judgment: POOR Insight: POOR   Key Points: Denies SI, HI, A&VH     Lab Results:   Admission on 08/05/2024  Component Date Value Ref Range Status   Lithium  Lvl 08/16/2024 0.50 (L)  0.60 - 1.20 mmol/L Final      Assessment & Plan  Principal Problem:   Sedative, hypnotic or anxiolytic use disorder, severe, in controlled environment [facility]) Active Problems:   Bipolar 1 disorder (HCC)   Delirium, drug-induced   Matthew Roach is a  38 y.o. male  with a past psychiatric history of bipolar  disorder with psychotic features, opioid use disorder, anxiety disorder. Patient initially arrived to [facility] on 9/14 for chest pain, and admitted to [facility] under IVC on 9/15 for stabilization of acute on chronic psychiatric conditions. PMHx is significant for seizures.    Patient presentation continues to be odd.  There is underlying paranoia about where he is leading to medication noncompliance.  The confused picture is not consistent with primary psychiatric diagnoses.  May be a substance-induced delirium, odd presentation for disorganized thought, a catatonic picture, or something else.  Chart documentation has strong evidence for an underlying bipolar disorder, which may or may not be contributing to this encounter.  Plan is roughly to continue the Valium  to help prevent seizures from withdrawal, stop offending agents, continues methadone , and get him on an antipsychotic.  IVC should stay in place as he cannot care for himself in this state.  It is unpredictable whether or not the psychotic component could lead to danger to others.   # Delirium, drug-induced # Sedative, hypnotic or anxiolytic-induced mood disorder, severe, in controlled environment - Olanzapine  5mg  in the morning, and continue Olanzapine  15mg  at night for anxiety and paranoia.                         Pt was stable on Olanzapine  15mg  nightly at discharge from [facility], and when asked if her prefer the Olanzapine  or Seroquel , he said he had no preferrence and would be good to try the Olanzapine  again.                         QTcB , Lipid panel somewhat elevated in April. A1c nonconcerning in April  - Haldol  5mg  Q12 hours (8am, 8pm) for paranoia  - Klonopin  0.5mg  daily to BID for anxiety for a few days  - Haldol  5mg  IM as a BACKUP if oral Olanzapine  is not taken - Methadone  110mg  daily             - IVC for concern of self-neglect from serious confusion putting pt at non-suicidal danger of harm to self                         -  Submitted 9/16, due 9/23   - Finished Ativan  taper on 9/20.    - Increasing Prozac  to 30mg  daily                         Pt on anti-manic medications             - Stopped Seroquel  50mg  nightly                         Replaced with Olanzapine     Psychiatry General PRNs             -- Trazodone  50 mg once nightly as needed for insomnia             -- Hydroxyzine  25 mg 3 times daily as needed for anxiety             -- Agitation protocol: Haldol , Benadryl , lorazepam     Medical Issues Being Addressed:  #none   Medical General PRNs             - Tylenol  tablets 650 mg every 6  hours as needed for pain - Maalox/Mylanta suspension 30 mL every 4 hours as needed for indigestion - Milk of Magnesia 30 mL daily as needed for constipation    Discharge Planning:              -- Social work and case management to assist with discharge planning and identification of hospital follow-up needs prior to discharge             -- Estimated LOS: 7-14 days              -- Discharge Concerns: Need to establish a safety plan; Medication compliance and effectiveness             -- Discharge Goals: Return home with outpatient referrals for mental health follow-up including medication management/psychotherapy  D. Penne Mori Psychiatry  PGY-1 08/17/2024, 10:45 AM

## 2024-08-18 MED ORDER — SENNOSIDES-DOCUSATE SODIUM 8.6-50 MG PO TABS
2.0000 | ORAL_TABLET | Freq: Once | ORAL | Status: AC
  Administered 2024-08-18: 2 via ORAL
  Filled 2024-08-18: qty 2

## 2024-08-18 MED ORDER — PSYLLIUM 95 % PO PACK
1.0000 | PACK | Freq: Every day | ORAL | Status: DC
  Administered 2024-08-18: 1 via ORAL
  Filled 2024-08-18 (×4): qty 1

## 2024-08-18 NOTE — Group Note (Incomplete)
 Date:  08/18/2024 Time:  8:44 PM  Group Topic/Focus:  Wrap-Up Group:   The focus of this group is to help patients review their daily goal of treatment and discuss progress on daily workbooks.    Participation Level:  Did Not Attend  Participation Quality:  Appropriate  Affect:  Appropriate  Cognitive:  Appropriate  Insight: Appropriate  Engagement in Group:  Engaged  Modes of Intervention:  Education and Exploration  Additional Comments:  Patient attended and participated in group tonight. He reports his goal was to attend all his group and to take his medication. He did meet his goal today  Annai Heick Dacosta 08/18/2024, 8:44 PM

## 2024-08-18 NOTE — Progress Notes (Signed)
(  Sleep Hours) -7.75 (Any PRNs that were needed, meds refused, or side effects to meds)- prn hydroxyzine  and trazodone  @ 2036 (Any disturbances and when (visitation, over night)-none (Concerns raised by the patient)-none  (SI/HI/AVH)- denies all

## 2024-08-18 NOTE — Progress Notes (Signed)
   08/18/24 0800  Psych Admission Type (Psych Patients Only)  Admission Status Involuntary  Psychosocial Assessment  Patient Complaints Anxiety  Eye Contact Darting  Facial Expression Flat  Affect Depressed  Speech Logical/coherent  Interaction Assertive  Motor Activity Slow  Appearance/Hygiene Unremarkable  Behavior Characteristics Cooperative  Mood Anxious  Thought Process  Coherency WDL  Content WDL  Delusions WDL  Perception WDL  Hallucination None reported or observed  Judgment Impaired  Confusion None  Danger to Self  Current suicidal ideation? Denies  Description of Suicide Plan No plan  Agreement Not to Harm Self Yes  Description of Agreement Verbal  Danger to Others  Danger to Others None reported or observed

## 2024-08-18 NOTE — [facility] Group Notes (Signed)
 Adult Psychoeducational Group Note  Date:  08/18/2024 Time:  7:37 PM  Group Topic/Focus:  Goals Group:   The focus of this group is to help patients establish daily goals to achieve during treatment and discuss how the patient can incorporate goal setting into their daily lives to aide in recovery. Orientation:   The focus of this group is to educate the patient on the purpose and policies of crisis stabilization and provide a format to answer questions about their admission.  The group details unit policies and expectations of patients while admitted.  Participation Level:  Active  Participation Quality:  Appropriate  Affect:  Appropriate  Cognitive:  Appropriate  Insight: Appropriate  Engagement in Group:  Engaged  Modes of Intervention:  Discussion  Additional Comments:  Pt attended the goals group and remained appropriate and engaged throughout the duration of the group.   Demri Poulton O 08/18/2024, 7:37 PM

## 2024-08-18 NOTE — Plan of Care (Signed)
   Problem: Education: Goal: Knowledge of [place] [facility] Education information/materials will improve Outcome: Progressing Goal: Emotional status will improve Outcome: Progressing Goal: Mental status will improve Outcome: Progressing

## 2024-08-18 NOTE — Progress Notes (Signed)
 [facility] Inpatient Psychiatry Progress Note  Date: 08/18/2024 Patient: Matthew Roach MRN: [id number]   Subjective  Matthew Roach is a 38 y.o. male  with a past psychiatric history of bipolar disorder with psychotic features, opioid use disorder, anxiety disorder. Patient initially arrived to [facility] on 9/14 for chest pain, and admitted to [facility] under IVC on 9/15 for stabilization of acute on chronic psychiatric conditions. PMHx is significant for seizures.    Psychiatric History: Previous psychiatric diagnoses: Polar disorder with psychotic features, opioid use disorder, anxiety disorder, seizures Prior psychiatric treatment: Lithium , Prozac , Remeron, Suboxone, diazepam , trazodone , olanzapine , methadone  Psychiatric medication compliance history: Noncompliant   Current psychiatric treatment: Diazepam , lithium , trazodone , olanzapine , methadone  Current psychiatrist: PCP Dr. Kennyth, psychiatric management post [facility] hospitalization has been handled by PCP  Current therapist: Denies   Previous hospitalizations: 02/2024: [facility] History of suicide attempts: None History of self harm: None   Interval Update   08/18/24   No significant events overnight. Vital Signs are Stable. MAR was reviewed and patient was compliant with medications yesterday. They received PRN trazodone , Milk of Magnesia, nicotine  gum, and hydroxyzine  yesterday. Case was discussed in the multidisciplinary team.    Psychiatric Team made the following recommendations yesterday: -Increase Prozac  to 30 mg daily   On interview patient continues to present more organized.  He states he is still having some bouts of depression and anxiety and is hopeful about the increased medication.  Patient states he slept good last night.  He reports his appetite is doing good.  He reports no SI, HI, or AVH.  He reports no paranoia or different complaints.     Discussed plan with patient to which they were  agreeable. Pt questions were answered.   Objective  Vitals: Blood pressure 107/79, pulse 84, temperature 97.8 F (36.6 C), temperature source Oral, resp. rate 16, height 6' (1.829 m), weight 83.9 kg, SpO2 97%.   Physical Examination:  Physical Exam Constitutional:      General: He is not in acute distress.    Appearance: Normal appearance. He is normal weight. He is not ill-appearing or toxic-appearing.  HENT:     Head: Normocephalic and atraumatic.  Pulmonary:     Effort: Pulmonary effort is normal.  Neurological:     General: No focal deficit present.     Mental Status: He is alert.    Review of Systems  Gastrointestinal:  Negative for abdominal pain, constipation, diarrhea, nausea and vomiting.  Psychiatric/Behavioral:  The patient is nervous/anxious.      Musculoskeletal:  Strength & Muscle Tone: within normal limits Gait & Station: normal   Mental Status Exam  Apperance: Appropriate for environment and PACING Behavior: Calm Speech: Normal Rate, Articulate, Normal Volume, and Responsive Attitude: Cooperative and Friendly Mood: I'm still a little anxious Affect: ANXIOUS, Normal Range, and Mood Congruent Perception: Not responding to internal stimuli Thought Content: within normal limits Thought Form: Goal Directed and Organized Cognition: Alert & Oriented to person, place, and time, Recent and Remote memory grossly intact by recounting personal history, and Immediate memory grossly intact by interview Judgment: Fair Insight: Fair     Key Points: Denies SI, HI, A&VH   Lab Results: Admission on 08/05/2024  Component Date Value Ref Range Status   Lithium  Lvl 08/16/2024 0.50 (L)  0.60 - 1.20 mmol/L Final      Assessment & Plan  Matthew Roach is a 38 y.o. male  with a past psychiatric history of bipolar disorder with psychotic features,  opioid use disorder, anxiety disorder. Patient initially arrived to [facility] on 9/14 for chest pain, and admitted to [facility]  under IVC on 9/15 for stabilization of acute on chronic psychiatric conditions. PMHx is significant for seizures.               Patient is presenting less paranoid today, though he is still endorsing some anxiety.  Patient states his confusion has the same.  Chart documentation no strong evidence for underlying bipolar disorder, which may be or may not be contributing to this encounter.  We will continue to monitor see if anxiety responds to the increased dose of Prozac .  We will continue the Valium  to help prevent seizures and withdrawal, stop offending agents, continue methadone , and continue the IVC.  We may plan for discharge early next week as he is doing significantly better.   # Delirium, drug-induced # Sedative, hypnotic or anxiolytic-induced mood disorder, severe, in controlled environment - Olanzapine  5mg  in the morning, and continue Olanzapine  15mg  at night for anxiety and paranoia.                         Pt was stable on Olanzapine  15mg  nightly at discharge from [facility], and when asked if her prefer the Olanzapine  or Seroquel , he said he had no preferrence and would be good to try the Olanzapine  again.                         QTcB , Lipid panel somewhat elevated in April. A1c nonconcerning in April             - Haldol  5mg  Q12 hours (8am, 8pm) for paranoia             - Klonopin  0.5mg  daily to BID for anxiety for a few days             - Haldol  5mg  IM as a BACKUP if oral Olanzapine  is not taken - Methadone  110mg  daily             - IVC for concern of self-neglect from serious confusion putting pt at non-suicidal danger of harm to self                         - Submitted 9/16, due 9/23              - Finished Ativan  taper on 9/20.               - Increasing Prozac  to 30mg  daily                         Pt on anti-manic medications             - Stopped Seroquel  50mg  nightly                         Replaced with Olanzapine     Psychiatry General PRNs             -- Trazodone  50  mg once nightly as needed for insomnia             -- Hydroxyzine  25 mg 3 times daily as needed for anxiety             -- Agitation protocol: Haldol , Benadryl , lorazepam     Medical Issues Being Addressed:  #none   Medical  General PRNs             - Tylenol  tablets 650 mg every 6 hours as needed for pain - Maalox/Mylanta suspension 30 mL every 4 hours as needed for indigestion - Milk of Magnesia 30 mL daily as needed for constipation    Discharge Planning:              -- Social work and case management to assist with discharge planning and identification of hospital follow-up needs prior to discharge             -- Estimated LOS: 7-14 days              -- Discharge Concerns: Need to establish a safety plan; Medication compliance and effectiveness             -- Discharge Goals: Return home with outpatient referrals for mental health follow-up including medication management/psychotherapy   Alan Maiden, MD Psychiatry  PGY-1 08/18/2024, 1:09 PM

## 2024-08-18 NOTE — Plan of Care (Signed)
  Problem: Education: Goal: Mental status will improve Outcome: Progressing   Problem: Activity: Goal: Interest or engagement in activities will improve Outcome: Progressing Goal: Sleeping patterns will improve Outcome: Progressing

## 2024-08-18 NOTE — Consult Note (Deleted)
 [facility] Inpatient Psychiatry Progress Note  Date: 08/18/2024 Patient: Matthew Roach MRN: [id number]   Subjective  Matthew Roach is a 38 y.o. male  with a past psychiatric history of bipolar disorder with psychotic features, opioid use disorder, anxiety disorder. Patient initially arrived to [facility] on 9/14 for chest pain, and admitted to [facility] under IVC on 9/15 for stabilization of acute on chronic psychiatric conditions. PMHx is significant for seizures.   Psychiatric History: Previous psychiatric diagnoses: Polar disorder with psychotic features, opioid use disorder, anxiety disorder, seizures Prior psychiatric treatment: Lithium , Prozac , Remeron, Suboxone, diazepam , trazodone , olanzapine , methadone  Psychiatric medication compliance history: Noncompliant   Current psychiatric treatment: Diazepam , lithium , trazodone , olanzapine , methadone  Current psychiatrist: PCP Dr. Kennyth, psychiatric management post [facility] hospitalization has been handled by PCP  Current therapist: Denies   Previous hospitalizations: 02/2024: [facility] History of suicide attempts: None History of self harm: None  Interval Update  08/18/24  No significant events overnight. Vital Signs are Stable. MAR was reviewed and patient was compliant with medications yesterday. They received PRN trazodone , Milk of Magnesia, nicotine  gum, and hydroxyzine  yesterday. Case was discussed in the multidisciplinary team.   Psychiatric Team made the following recommendations yesterday: -Increase Prozac  to 30 mg daily  On interview patient continues to present more organized.  He states he is still having some bouts of depression and anxiety and is hopeful about the increased medication.  Patient states he slept good last night.  He reports his appetite is doing good.  He reports no SI, HI, or AVH.  He reports no paranoia or different complaints.    Discussed plan with patient to which they were agreeable. Pt  questions were answered.   Objective  Vitals: Blood pressure 107/79, pulse 84, temperature 97.8 F (36.6 C), temperature source Oral, resp. rate 16, height 6' (1.829 m), weight 83.9 kg, SpO2 97%.   Physical Examination:  Physical Exam Constitutional:      General: He is not in acute distress.    Appearance: Normal appearance. He is normal weight. He is not ill-appearing or toxic-appearing.    Review of Systems  Gastrointestinal:  Negative for abdominal pain, constipation, diarrhea, nausea and vomiting.  Neurological:  Negative for headaches.  Psychiatric/Behavioral:  Negative for hallucinations and suicidal ideas. The patient is nervous/anxious.      Musculoskeletal:  Strength & Muscle Tone: within normal limits Gait & Station: normal   Mental Status Exam  Apperance: Appropriate for environment and PACING Behavior: Calm Speech: Normal Rate, Articulate, Normal Volume, and Responsive Attitude: Cooperative and Friendly Mood: I'm still a little anxious Affect: ANXIOUS, Normal Range, and Mood Congruent Perception: Not responding to internal stimuli Thought Content: within normal limits Thought Form: Goal Directed and Organized Cognition: Alert & Oriented to person, place, and time, Recent and Remote memory grossly intact by recounting personal history, and Immediate memory grossly intact by interview Judgment: Fair Insight: Fair   Key Points: Denies SI, HI, A&VH   Lab Results: Admission on 08/05/2024  Component Date Value Ref Range Status   Lithium  Lvl 08/16/2024 0.50 (L)  0.60 - 1.20 mmol/L Final      Assessment & Plan  Matthew Roach is a 38 y.o. male  with a past psychiatric history of bipolar disorder with psychotic features, opioid use disorder, anxiety disorder. Patient initially arrived to [facility] on 9/14 for chest pain, and admitted to [facility] under IVC on 9/15 for stabilization of acute on chronic psychiatric conditions. PMHx is significant for seizures.  Patient is presenting less paranoid today, though he is still endorsing some anxiety.  Patient states his confusion has the same.  Chart documentation no strong evidence for underlying bipolar disorder, which may be or may not be contributing to this encounter.  We will continue to monitor see if anxiety responds to the increased dose of Prozac .  We will continue the Valium  to help prevent seizures and withdrawal, stop offending agents, continue methadone , and continue the IVC.  We may plan for discharge early next week as he is doing significantly better.   # Delirium, drug-induced # Sedative, hypnotic or anxiolytic-induced mood disorder, severe, in controlled environment - Olanzapine  5mg  in the morning, and continue Olanzapine  15mg  at night for anxiety and paranoia.                         Pt was stable on Olanzapine  15mg  nightly at discharge from [facility], and when asked if her prefer the Olanzapine  or Seroquel , he said he had no preferrence and would be good to try the Olanzapine  again.                         QTcB , Lipid panel somewhat elevated in April. A1c nonconcerning in April             - Haldol  5mg  Q12 hours (8am, 8pm) for paranoia             - Klonopin  0.5mg  daily to BID for anxiety for a few days             - Haldol  5mg  IM as a BACKUP if oral Olanzapine  is not taken - Methadone  110mg  daily             - IVC for concern of self-neglect from serious confusion putting pt at non-suicidal danger of harm to self                         - Submitted 9/16, due 9/23              - Finished Ativan  taper on 9/20.               - Increasing Prozac  to 30mg  daily                         Pt on anti-manic medications             - Stopped Seroquel  50mg  nightly                         Replaced with Olanzapine     Psychiatry General PRNs             -- Trazodone  50 mg once nightly as needed for insomnia             -- Hydroxyzine  25 mg 3 times daily as needed for anxiety              -- Agitation protocol: Haldol , Benadryl , lorazepam     Medical Issues Being Addressed:  #none   Medical General PRNs             - Tylenol  tablets 650 mg every 6 hours as needed for pain - Maalox/Mylanta suspension 30 mL every 4 hours as needed for indigestion - Milk of Magnesia 30 mL daily as needed for constipation  Discharge Planning:              -- Social work and case management to assist with discharge planning and identification of hospital follow-up needs prior to discharge             -- Estimated LOS: 7-14 days              -- Discharge Concerns: Need to establish a safety plan; Medication compliance and effectiveness             -- Discharge Goals: Return home with outpatient referrals for mental health follow-up including medication management/psychotherapy    Alan Maiden, MD Psychiatry  PGY-1 08/18/2024, 11:18 AM

## 2024-08-19 MED ORDER — MAGNESIUM CITRATE PO SOLN
1.0000 | Freq: Once | ORAL | Status: AC
  Administered 2024-08-19: 1 via ORAL
  Filled 2024-08-19: qty 296

## 2024-08-19 NOTE — Progress Notes (Signed)
(  Sleep Hours) -7.5 (Any PRNs that were needed, meds refused, or side effects to meds)- prn hydroxyzine  and trazodone  @ 2041 (Any disturbances and when (visitation, over night)-none (Concerns raised by the patient)- none (SI/HI/AVH)- denies all

## 2024-08-19 NOTE — Group Note (Signed)
 Date:  08/19/2024 Time:  9:14 PM  Group Topic/Focus:  Wrap-Up Group:   The focus of this group is to help patients review their daily goal of treatment and discuss progress on daily workbooks.    Participation Level:  Active  Participation Quality:  Appropriate  Affect:  Appropriate  Cognitive:  Appropriate  Insight: Appropriate  Engagement in Group:  Engaged  Modes of Intervention:  Education and Exploration  Additional Comments:  Patient attended and participated in group tonight. He reports that he like that the hospital is here and available to deal with crisis.  Demetra Moya Dacosta 08/19/2024, 9:14 PM

## 2024-08-19 NOTE — Progress Notes (Signed)
(  Sleep Hours) -  (Any PRNs that were needed, meds refused, or side effects to meds)- hydroxyzine  25mg , Trazodone  100mg   (Any disturbances and when (visitation, over night)-none  (Concerns raised by the patient)- none  (SI/HI/AVH)- denies

## 2024-08-19 NOTE — Group Note (Signed)
 Recreation Therapy Group Note   Group Topic:Team Building  Group Date: 08/19/2024 Start Time: 1036 End Time: 1100 Facilitators: Ahliyah Nienow-McCall, LRT,CTRS Location: [address] Dayroom   Group Topic: Communication, Team Building, Problem Solving  Goal Area(s) Addresses:  Patient will effectively work with peer towards shared goal.  Patient will identify skills used to make activity successful.  Patient will identify how skills used during activity can be applied to reach post d/c goals.   Behavioral Response: Minimal  Intervention: STEM Activity- Glass blower/designer  Activity: Tallest [employer]. In teams of 5-6, patients were given 11 craft pipe cleaners. Using the materials provided, patients were instructed to compete again the opposing team(s) to build the tallest free-standing structure from floor level. The activity was timed; difficulty increased by Clinical research associate as Production designer, theatre/television/film continued.  Systematically resources were removed with additional directions for example, placing one arm behind their back, working in silence, and shape stipulations. LRT facilitated post-activity discussion reviewing team processes and necessary communication skills involved in completion. Patients were encouraged to reflect how the skills utilized, or not utilized, in this activity can be incorporated to positively impact support systems post discharge.  Education: Pharmacist, community, Scientist, physiological, Discharge Planning   Education Outcome: Acknowledges education/In group clarification offered/Needs additional education.    Affect/Mood: Flat   Participation Level: Minimal   Participation Quality: Independent   Behavior: Sleepy   Speech/Thought Process: Relevant   Insight: Fair   Judgement: Fair    Modes of Intervention: STEM Activity   Patient Response to Interventions:  Receptive   Education Outcome:  In group clarification offered    Clinical Observations/Individualized  Feedback: Pt was drowsy with minimal participation. Pt eventually fell asleep but woke up when peers got him involved with assisting them. Pt eventually helped peers finish the construction of their tower.      Plan: Continue to engage patient in RT group sessions 2-3x/week.   Asmar Brozek-McCall, LRT,CTRS 08/19/2024 1:27 PM

## 2024-08-19 NOTE — Plan of Care (Signed)
   Problem: Education: Goal: Mental status will improve Outcome: Progressing   Problem: Activity: Goal: Interest or engagement in activities will improve Outcome: Progressing

## 2024-08-19 NOTE — Progress Notes (Signed)
 [facility] Inpatient Psychiatry Progress Note  Date: 08/19/2024 Patient: Matthew Roach MRN: [id number]   Subjective  Matthew Roach is a 38 y.o. male  with a past psychiatric history of bipolar disorder with psychotic features, opioid use disorder, anxiety disorder. Patient initially arrived to [facility] on 9/14 for chest pain, and admitted to [facility] under IVC on 9/15 for stabilization of acute on chronic psychiatric conditions. PMHx is significant for seizures.   Psychiatric History: Previous psychiatric diagnoses: Polar disorder with psychotic features, opioid use disorder, anxiety disorder, seizures Prior psychiatric treatment: Lithium , Prozac , Remeron, Suboxone, diazepam , trazodone , olanzapine , methadone  Psychiatric medication compliance history: Noncompliant   Current psychiatric treatment: Diazepam , lithium , trazodone , olanzapine , methadone  Current psychiatrist: PCP Dr. Kennyth, psychiatric management post [facility] hospitalization has been handled by PCP  Current therapist: Denies   Previous hospitalizations: 02/2024: [facility] History of suicide attempts: None History of self harm: None  Interval Update  08/19/24  No significant events overnight. MAR was reviewed and patient was compliant with medications yesterday. Case was discussed in the multidisciplinary team.   On interview patient presents more organized. He endorses some depression and his anxiety is up and down. Delusions not volunteered, but present on asking. Remain non-bizarre and pt has fair judgment. He plans to change his methadone  clinic in response to them, and has no ill-intent toward the program director there. Denies physical complaints aside from constipation which is being handled with PRNs. Will likely DC tomorrow. Denies side-effects of recent medication changes. No changes in medication plan today.  Denies SI, HI, A&VH.  Objective  Vitals: Blood pressure (!) 119/91, pulse 84,  temperature (!) 97.3 F (36.3 C), temperature source Oral, resp. rate 16, height 6' (1.829 m), weight 83.9 kg, SpO2 100%.   Physical Examination:  Physical Exam Vitals and nursing note reviewed. Exam conducted with a chaperone present.  Constitutional:      General: He is not in acute distress.    Appearance: He is normal weight.  Pulmonary:     Effort: Pulmonary effort is normal. No respiratory distress.  Skin:    General: Skin is warm and dry.  Neurological:     Mental Status: He is alert.     Gait: Gait normal.    Review of Systems  Constitutional: Negative.  Negative for fever.  Respiratory: Negative.  Negative for shortness of breath.   Cardiovascular:  Negative for palpitations and leg swelling.  Gastrointestinal: Negative.  Negative for constipation and diarrhea.  Neurological:  Negative for dizziness.     Musculoskeletal:  Strength & Muscle Tone: within normal limits Gait & Station: normal   Mental Status Exam  Apperance: Appropriate for environment, Casual, and Standing Behavior: Calm Speech: Normal Rate, Articulate, Normal Volume, and Responsive Attitude: Cooperative and Friendly Mood: okay Affect: ANXIOUS, RESTRICTED, and Mood Congruent Perception: Not responding to internal stimuli Thought Content: within normal limits Thought Form: Goal Directed, Organized, Linear, and Logical Cognition: Alert & Oriented to person, place, and time, Recent and Remote memory grossly intact by recounting personal history, and Immediate memory grossly intact by interview Judgment: Fair Insight: Fair   Key Points: Denies SI, HI, A&VH     Lab Results:   Admission on 08/05/2024  Component Date Value Ref Range Status   Lithium  Lvl 08/16/2024 0.50 (L)  0.60 - 1.20 mmol/L Final      Assessment & Plan  Principal Problem:   Sedative, hypnotic or anxiolytic use disorder, severe, in controlled environment [facility]) Active Problems:   Bipolar  1 disorder (HCC)    Delirium, drug-induced   Matthew Roach is a 38 y.o. male  with a past psychiatric history of bipolar disorder with psychotic features, opioid use disorder, anxiety disorder. Patient initially arrived to [facility] on 9/14 for chest pain, and admitted to [facility] under IVC on 9/15 for stabilization of acute on chronic psychiatric conditions. PMHx is significant for seizures.    Patient presentation continues to be odd.  There is underlying paranoia about where he is leading to medication noncompliance.  The confused picture is not consistent with primary psychiatric diagnoses.  May be a substance-induced delirium, odd presentation for disorganized thought, a catatonic picture, or something else.  Chart documentation has strong evidence for an underlying bipolar disorder, which may or may not be contributing to this encounter.  Plan is roughly to continue the Valium  to help prevent seizures from withdrawal, stop offending agents, continues methadone , and get him on an antipsychotic.  IVC should stay in place as he cannot care for himself in this state.  It is unpredictable whether or not the psychotic component could lead to danger to others.   # Delirium, drug-induced # Sedative, hypnotic or anxiolytic-induced mood disorder, severe, in controlled environment - Olanzapine  5mg  in the morning, and continue Olanzapine  15mg  at night for anxiety and paranoia.                         Pt was stable on Olanzapine  15mg  nightly at discharge from [facility], and when asked if her prefer the Olanzapine  or Seroquel , he said he had no preferrence and would be good to try the Olanzapine  again.                         QTcB , Lipid panel somewhat elevated in April. A1c nonconcerning in April  - Haldol  5mg  Q12 hours (8am, 8pm) for paranoia  - Klonopin  0.5mg  daily to BID for anxiety for a few days  - Haldol  5mg  IM as a BACKUP if oral Olanzapine  is not taken - Methadone  110mg  daily             - IVC for concern of  self-neglect from serious confusion putting pt at non-suicidal danger of harm to self                         - Submitted 9/16, due 9/23   - Finished Ativan  taper on 9/20.    - Prozac  30mg  daily                         Pt on anti-manic medications             - Stopped Seroquel  50mg  nightly                         Replaced with Olanzapine     Psychiatry General PRNs             -- Trazodone  50 mg once nightly as needed for insomnia             -- Hydroxyzine  25 mg 3 times daily as needed for anxiety             -- Agitation protocol: Haldol , Benadryl , lorazepam     Medical Issues Being Addressed:  #none   Medical General PRNs             -  Tylenol  tablets 650 mg every 6 hours as needed for pain - Maalox/Mylanta suspension 30 mL every 4 hours as needed for indigestion - Milk of Magnesia 30 mL daily as needed for constipation    Discharge Planning:              -- Social work and case management to assist with discharge planning and identification of hospital follow-up needs prior to discharge             -- Estimated LOS: 7-14 days              -- Discharge Concerns: Need to establish a safety plan; Medication compliance and effectiveness             -- Discharge Goals: Return home with outpatient referrals for mental health follow-up including medication management/psychotherapy  D. Penne Mori Psychiatry  PGY-1 08/19/2024, 10:27 AM

## 2024-08-19 NOTE — [facility] Group Notes (Signed)
 Adult Psychoeducational Group Note  Date:  08/19/2024 Time:  10:10 AM  Group Topic/Focus:  Goals Group:   The focus of this group is to help patients establish daily goals to achieve during treatment and discuss how the patient can incorporate goal setting into their daily lives to aide in recovery. Orientation:   The focus of this group is to educate the patient on the purpose and policies of crisis stabilization and provide a format to answer questions about their admission.  The group details unit policies and expectations of patients while admitted.  Participation Level:  Active  Participation Quality:  Appropriate  Affect:  Appropriate  Cognitive:  Disorganized  Insight: Appropriate  Engagement in Group:  Engaged  Modes of Intervention:  Discussion  Additional Comments:  Pt attended the goals group and remained appropriate and engaged throughout the duration of the group.   Neidy Guerrieri O 08/19/2024, 10:10 AM

## 2024-08-19 NOTE — Plan of Care (Signed)
   Problem: Education: Goal: Emotional status will improve Outcome: Progressing Goal: Mental status will improve Outcome: Progressing Goal: Verbalization of understanding the information provided will improve Outcome: Progressing   Problem: Activity: Goal: Interest or engagement in activities will improve Outcome: Progressing

## 2024-08-20 ENCOUNTER — Telehealth: Payer: Self-pay | Admitting: Family Medicine

## 2024-08-20 DIAGNOSIS — F319 Bipolar disorder, unspecified: Principal | ICD-10-CM

## 2024-08-20 MED ORDER — HALOPERIDOL 5 MG PO TABS: 5.0000 mg | ORAL_TABLET | Freq: Two times a day (BID) | ORAL | 0 refills | Status: AC

## 2024-08-20 MED ORDER — LITHIUM CARBONATE ER 300 MG PO TBCR: 300.0000 mg | EXTENDED_RELEASE_TABLET | Freq: Two times a day (BID) | ORAL | 0 refills | Status: AC

## 2024-08-20 MED ORDER — TRAZODONE HCL 100 MG PO TABS: 100.0000 mg | ORAL_TABLET | Freq: Every evening | ORAL | 0 refills | Status: AC | PRN

## 2024-08-20 MED ORDER — FLUOXETINE HCL 10 MG PO CAPS: 30.0000 mg | ORAL_CAPSULE | Freq: Every day | ORAL | 0 refills | Status: AC

## 2024-08-20 MED ORDER — OLANZAPINE 15 MG PO TABS: 15.0000 mg | ORAL_TABLET | Freq: Every day | ORAL | 0 refills | Status: AC

## 2024-08-20 MED ORDER — OLANZAPINE 5 MG PO TABS: 5.0000 mg | ORAL_TABLET | Freq: Every day | ORAL | 0 refills | Status: AC

## 2024-08-20 MED ORDER — CLONAZEPAM 0.5 MG PO TABS: 0.5000 mg | ORAL_TABLET | Freq: Two times a day (BID) | ORAL | 0 refills | Status: DC

## 2024-08-20 MED ORDER — NICOTINE POLACRILEX 2 MG MT GUM: 2.0000 mg | CHEWING_GUM | OROMUCOSAL | 0 refills | Status: AC | PRN

## 2024-08-20 NOTE — [facility] Suicide Risk Assessment (Signed)
 Suicide Risk Assessment  Discharge Assessment    [facility] Discharge Suicide Risk Assessment   Principal Problem: Sedative, hypnotic or anxiolytic use disorder, severe, in controlled environment [facility]) Discharge Diagnoses: Principal Problem:   Sedative, hypnotic or anxiolytic use disorder, severe, in controlled environment [facility]) Active Problems:   Bipolar 1 disorder (HCC)   Delirium, drug-induced   Total Time spent with patient: 15 minutes  During the patient's hospitalization, patient had extensive initial psychiatric evaluation, and follow-up psychiatric evaluations every day.  Psychiatric diagnoses provided upon initial assessment: Delirium. Sedative, hypnotic or anxiolytic-induced mood disorder  Patient's psychiatric medications were adjusted on admission: Reduced Valium  from 40mg  daily to eventually klonopin  5mg  BID. Stopped Prozac  and Seroquel . Started Olanzapine .  During the hospitalization, other adjustments were made to the patient's psychiatric medication regimen: Final regiment listed below. Antipsychotics were slowly titrated and we do believe he needs to be on 2 anti-psychotics (Olanzapine  and Haldol ). Each gave a significant improvement, pt was overly delusional without the 2nd antipsychotic.  Gradually, patient started adjusting to milieu.   Patient's care was discussed during the interdisciplinary team meeting every day during the hospitalization.  The patient endorses having side some sedation to prescribed psychiatric medication.  The patient reports their target psychiatric symptoms of delusions and paranoia responded well to the psychiatric medications, and the patient reports overall benefit other psychiatric hospitalization. Supportive psychotherapy was provided to the patient. The patient also participated in regular group therapy while admitted.   Labs were reviewed with the patient, and abnormal results were discussed with the patient.  The patient denied having  suicidal thoughts more than 48 hours prior to discharge.  Patient denies having homicidal thoughts.  Patient denies having auditory hallucinations.  Patient denies any visual hallucinations.  Patient denies having paranoid thoughts.  The patient is able to verbalize their individual safety plan to this provider.  It is recommended to the patient to continue psychiatric medications as prescribed, after discharge from the hospital.    It is recommended to the patient to follow up with your outpatient psychiatric provider and PCP.  Discussed with the patient, the impact of alcohol, drugs, tobacco have been there overall psychiatric and medical wellbeing, and total abstinence from substance use was recommended the patient.   Musculoskeletal: Strength & Muscle Tone: within normal limits Gait & Station: normal Patient leans: N/A  Psychiatric Specialty Exam  Presentation  General Appearance:  Appropriate for Environment; Casual  Eye Contact: Fair  Speech: Clear and Coherent  Speech Volume: Normal  Handedness:No data recorded  Mood and Affect  Mood: Euthymic  Duration of Depression Symptoms: No data recorded Affect: Constricted; Appropriate   Thought Process  Thought Processes: Linear; Coherent  Descriptions of Associations:Intact  Orientation:Full (Time, Place and Person)  Thought Content:Logical  History of Schizophrenia/Schizoaffective disorder:No data recorded Duration of Psychotic Symptoms:No data recorded Hallucinations:Hallucinations: None  Ideas of Reference:None  Suicidal Thoughts:Suicidal Thoughts: No  Homicidal Thoughts:Homicidal Thoughts: No   Sensorium  Memory: Immediate Fair; Recent Fair; Remote Fair  Judgment: Fair  Insight: Fair   Art therapist  Concentration: Fair  Attention Span:No data recorded Recall:No data recorded Fund of Knowledge:No data recorded Language:No data recorded  Psychomotor Activity  Psychomotor  Activity: Psychomotor Activity: Normal   Assets  Assets: Manufacturing systems engineer; Desire for Improvement; Financial Resources/Insurance; Housing; Physical Health; Social Support   Sleep  Sleep: Sleep: Fair  Estimated Sleeping Duration (Last 24 Hours): 6.50-7.75 hours  Physical Exam: Physical Exam Vitals and nursing note reviewed. Exam conducted with a chaperone  present.  Constitutional:      General: He is not in acute distress.    Appearance: He is not toxic-appearing.  Pulmonary:     Effort: Pulmonary effort is normal. No respiratory distress.  Skin:    General: Skin is warm and dry.  Neurological:     Mental Status: He is alert.     Gait: Gait normal.    Review of Systems  Constitutional: Negative.  Negative for fever.  Respiratory: Negative.  Negative for shortness of breath.   Cardiovascular: Negative.   Gastrointestinal: Negative.  Negative for constipation and diarrhea.   Blood pressure 110/77, pulse 71, temperature 97.7 F (36.5 C), temperature source Oral, resp. rate 16, height 6' (1.829 m), weight 83.9 kg, SpO2 97%. Body mass index is 25.09 kg/m.  Mental Status Per Nursing Assessment::   On Admission:  Suicidal ideation indicated by others  Demographic Factors:  Male and Unemployed  Loss Factors: NA  Historical Factors: NA  Risk Reduction Factors:   Sense of responsibility to family, Living with another person, especially a relative, and Positive social support  Continued Clinical Symptoms:  Bipolar Disorder:   Mixed State Schizophrenia:   Paranoid or undifferentiated type  Cognitive Features That Contribute To Risk:  Loss of executive function    Suicide Risk:  Minimal: No identifiable suicidal ideation. Pt mood is well controlled on current medications.   Follow-up Information     [facility] ([facility] Follow up on 08/16/2024.   Why: You may call this provider on 08/16/24 at 6:00 am to schedule appointments for  therapy and medication management services. Contact information: [facility] [address] [place], [place] [address]        [facility] Follow up on 08/20/2024.   Why: You have a hospital follow up appointment for therapy and medication management services on  9/30 at 3:15 pm.  The appointment will be Virtual, telehealth. Contact information: [address]  [address] [place] [address] [phone]                 Plan Of Care/Follow-up recommendations:  Activity: as tolerated  Diet: heart healthy  Other: -Follow-up with your outpatient psychiatric provider -instructions on appointment date, time, and address (location) are provided to you in discharge paperwork.  -Take your psychiatric medications as prescribed at discharge - instructions are provided to you in the discharge paperwork  -Follow-up with outpatient primary care doctor and other specialists -for management of chronic medical disease  -Recommend abstinence from alcohol, tobacco, and other illicit drug use at discharge.   -If your psychiatric symptoms recur, worsen, or if you have side effects to your psychiatric medications, call your outpatient psychiatric provider, 911, [phone] or go to the nearest emergency department.  -If suicidal thoughts recur, call your outpatient psychiatric provider, 911, [phone] or go to the nearest emergency department.   Penne Mori, DO 08/20/2024, 8:58 AM

## 2024-08-20 NOTE — [facility] Suicide Risk Assessment (Signed)
 [facility] INPATIENT:  Family/Significant Other Suicide Prevention Education  Suicide Prevention Education:  Education Completed; Avant Printy (brother) [phone],  (name of family member/significant other) has been identified by the patient as the family member/significant other with whom the patient will be residing, and identified as the person(s) who will aid the patient in the event of a mental health crisis (suicidal ideations/suicide attempt).  With written consent from the patient, the family member/significant other has been provided the following suicide prevention education, prior to the and/or following the discharge of the patient.  Kamran confirmed patient will not have access to firearms/guns/weapons after discharge to harm himself or others. Lyle reported he'd be able to assist patient should he have a mental health crisis and need support. CSW educated Lyle about resources offered should patient have a crisis, including [facility] location, phone number, hours, and services offered. Lyle reported he'd be willing to assist patient with safely storing and securing medications. Lyle denied having any safety concerns related to patient discharging home.    The suicide prevention education provided includes the following: Suicide risk factors Suicide prevention and interventions National Suicide Hotline telephone number [facility] assessment telephone number [facility] Emergency Assistance 911 [facility] and/or Residential Mobile Crisis Unit telephone number  Request made of family/significant other to: Remove weapons (e.g., guns, rifles, knives), all items previously/currently identified as safety concern.   Remove drugs/medications (over-the-counter, prescriptions, illicit drugs), all items previously/currently identified as a safety concern.  The family member/significant other verbalizes understanding of the suicide prevention education information  provided.  The family member/significant other agrees to remove the items of safety concern listed above.  Jene Huq M Tonna Palazzi, LCSWA 08/20/2024, 9:30 AM

## 2024-08-20 NOTE — Group Note (Signed)
 Recreation Therapy Group Note   Group Topic:Coping Skills  Group Date: 08/20/2024 Start Time: 1005 End Time: 1100 Facilitators: Solace Wendorff-McCall, LRT,CTRS Location: [address] Dayroom   Group Topic: Coping Skills  Goal Area(s) Addresses:  Patient will successfully define what a coping skill is. Patient will acknowledge current strategies used in terms of healthy vs unhealthy. Patient will create a visual display of at least 5 positive coping skills. Patient will successfully identify benefit of using outlined coping skills post d/c.  Behavioral Response: None  Intervention: Art Poster- scissors, glue, magazines, printed chart, music  Activity: Patients were asked to fill in a coping skills chart using images and words found from magazines. As a group, patients were prompted to discuss what coping skills are, when they need to be utilized, and the importance of selection based on various triggers. Coping skills on the chart were identified by 5 categories - Diversion, Social, Cognitive, Tension Releasers, and Physical. LRT requested that patients represent at least 2 coping skills per category. Patients were then asked to present their collage-style artwork to the group.   Education: Pharmacologist, Scientist, physiological, Discharge Planning.   Education Outcome: Acknowledges education/In group clarification offered/Needs additional education.    Affect/Mood: Flat   Participation Level: None   Participation Quality: None   Behavior: Disinterested   Speech/Thought Process: None   Insight: None   Judgement: None   Modes of Intervention: Art   Patient Response to Interventions:  Disengaged   Education Outcome:  In group clarification offered    Clinical Observations/Individualized Feedback: Pt came in half way through group. Pt didn't participate in activity. Pt read through a magazine while in group.     Plan: Continue to engage patient in RT group sessions  2-3x/week.   Brittainy Bucker-McCall, LRT,CTRS  08/20/2024 12:31 PM

## 2024-08-20 NOTE — Progress Notes (Signed)
  [facility] Adult Case Management Discharge Plan :  Will you be returning to the same living situation after discharge:  Yes,  pt returning home with family. At discharge, do you have transportation home?: Yes,  pt's brother, Lyle will be providing transportation at 12pm. Do you have the ability to pay for your medications: Yes,  pt is insured - [employer]  Release of information consent forms completed and in the chart;  Patient's signature needed at discharge.  Patient to Follow up at:  Follow-up Information     [facility]) Follow up on 08/20/2024.   Why: You may call this provider on 08/20/24 at 6:00 am to schedule appointments for therapy and medication management services. Contact information: [facility] [address] [place], [place] [address]        [facility] Follow up on 08/20/2024.   Why: You have a hospital follow up appointment for therapy and medication management services on  9/30 at 3:15 pm.  The appointment will be Virtual, telehealth. Contact information: [address]  [address] [place] [address] [phone]                 Next level of care provider has access to [facility] Link:no  Safety Planning and Suicide Prevention discussed: Yes,  completed with Gerrard Crystal (brother) [phone]     Has patient been referred to the Quitline?: Patient does not use tobacco/nicotine  products  Patient has been referred for addiction treatment: The patient will follow-up with an outpatient provider for substance use disorder at [facility] ([facility]) on 08/20/24.  Kortlynn Poust M Tamyka Bezio, LCSWA 08/20/2024, 9:25 AM

## 2024-08-20 NOTE — Telephone Encounter (Signed)
 LVM to return call   Unable to give information to brother Matthew Roach  about Patient,  No authorization given in last DPR in chart

## 2024-08-20 NOTE — Discharge Summary (Signed)
 Physician Discharge Summary Note  Patient:  Matthew Roach is an 38 y.o., male MRN:  [id number] DOB:  [birth year] Patient phone:  [phone] (home)  Patient address:   [address] [place] [address],  Total Time spent with patient: 15 minutes  Date of Admission:  08/05/2024 Date of Discharge: 08/20/2024  Reason for Admission:  Sedative, hypnotic or anxiolytic-induced mood disorder  Principal Problem: Sedative, hypnotic or anxiolytic use disorder, severe, in controlled environment [facility]) Discharge Diagnoses: Principal Problem:   Sedative, hypnotic or anxiolytic use disorder, severe, in controlled environment [facility]) Active Problems:   Bipolar 1 disorder (HCC)   Delirium, drug-induced  Hospital Course:   Matthew Roach is a 38 y.o. male  with a past psychiatric history of bipolar disorder with psychotic features, opioid use disorder, anxiety disorder. Patient initially arrived to [facility] on 9/14 for chest pain, and admitted to [facility] under IVC on 9/15 for stabilization of acute on chronic psychiatric conditions. PMHx is significant for seizures.   Patient presentation atypical.  He presented to the emergency department because of concern for his aorta.  He was noted that he was very confused.  Credible history of at least 1 prior manic episode in April 2025 with impulsiveness, bizarre gesturing, and dangerously driving 879 mph.  Patient is on methadone  for heroin use and has been on this for over a decade.  He has been on a high dose of Valium  40 mg for many years as well.  During admission, patient was confused and paranoid of staff and medicines, refusing to take many of the medications.  Forced medications were required when patient stopped taking benzodiazepines, and he was at high risk of withdrawal seizures. He was compliant after 1 forced administration. We tapered down and discontinued the Ativan  on 9/20, but added Klonopin  at a 0.5mg  dose for ongoing anxiety. We restarted the  olanzapine  and titrated it up to 5mg  in the morning and 15mg  nightly. At this dose, paranoia was still very present, and Haldol  5mg  BID was added for treating paranoia which was successful. Lithium  was reintroduced on 9/21 with a final dose of 300mg  BID. Prozac  was reintroduced to 30mg  daily, and felt safe to do so from a mania perspective because of the antipsychotics and lithium  that are on board.   Non-bizarre delusions were greatly improved, but still present. Specifically one of past [facility] surgeon of methadone  clinic being a part of a gang were still present, but judgement was intact and pt had no ill-intent toward this person, and intended to simply change his methadone  clinic to [facility]. Per family, he was back to baseline at time of discharge.   During the patient's hospitalization, patient had extensive initial psychiatric evaluation, and follow-up psychiatric evaluations every day.  Psychiatric diagnoses provided upon initial assessment: Delirium. Sedative, hypnotic or anxiolytic-induced mood disorder   Patient's psychiatric medications were adjusted on admission: Reduced Valium  from 40mg  daily to eventually klonopin  5mg  BID. Stopped Prozac  and Seroquel . Started Olanzapine .   Patient's care was discussed during the interdisciplinary team meeting every day during the hospitalization.  The patient denies having side effects to prescribed psychiatric medication.  Gradually, patient started adjusting to milieu. The patient was evaluated each day by a clinical provider to ascertain response to treatment. Improvement was noted by the patient's report of decreasing symptoms, improved sleep and appetite, affect, medication tolerance, behavior, and participation in unit programming.  Patient was asked each day to complete a self inventory noting mood, mental status, pain, new symptoms, anxiety  and concerns.   Symptoms were reported as significantly decreased or resolved completely by  discharge.  The patient reports that their mood is stable.  The patient denied having suicidal thoughts for more than 48 hours prior to discharge.  Patient denies having homicidal thoughts.  Patient denies having auditory hallucinations.  Patient denies any visual hallucinations or other symptoms of psychosis.  The patient was motivated to continue taking medication with a goal of continued improvement in mental health.   The patient reports their target psychiatric symptoms of anxiety and paranoia responded well to the psychiatric medications, and the patient reports overall benefit other psychiatric hospitalization. Supportive psychotherapy was provided to the patient. The patient also participated in regular group therapy while hospitalized. Coping skills, problem solving as well as relaxation therapies were also part of the unit programming.  Labs were reviewed with the patient, and abnormal results were discussed with the patient.  The patient is able to verbalize their individual safety plan to this provider.  # It is recommended to the patient to continue psychiatric medications as prescribed, after discharge from the hospital.    # It is recommended to the patient to follow up with your outpatient psychiatric provider and PCP.  # It was discussed with the patient, the impact of alcohol, drugs, tobacco have been there overall psychiatric and medical wellbeing, and total abstinence from substance use was recommended the patient.ed.  # Prescriptions provided or sent directly to preferred pharmacy at discharge. Patient agreeable to plan. Given opportunity to ask questions. Appears to feel comfortable with discharge.    # In the event of worsening symptoms, the patient is instructed to call the crisis hotline, 911 and or go to the nearest ED for appropriate evaluation and treatment of symptoms. To follow-up with primary care provider for other medical issues, concerns and or health care  needs  # Patient was discharged to home with a plan to follow up as noted below.    On day of discharge, pt was feeling that his anxiety was still much better, though still present. He denied SI, HI, A&VH. We reviewed his medications and he was intending to continue taking them and showed fair insight and judgment into his treatment plan moving forward, wanting to include his family in the process so they could intervene when necessary. He had no further questions.    Past Psychiatric History:  Previous psychiatric diagnoses: Polar disorder with psychotic features, opioid use disorder, anxiety disorder, seizures Prior psychiatric treatment: Lithium , Prozac , Remeron, Suboxone, diazepam , trazodone , olanzapine , methadone  Psychiatric medication compliance history: Noncompliant   Current psychiatric treatment: Diazepam , lithium , trazodone , olanzapine , methadone  Current psychiatrist: PCP Dr. Kennyth, psychiatric management post [facility] hospitalization has been handled by PCP  Current therapist: Denies   Previous hospitalizations: 02/2024: [facility] History of suicide attempts: None History of self harm: None  Past Medical History:  Past Medical History:  Diagnosis Date   Anxiety    Depression    Seizures (HCC)    History reviewed. No pertinent surgical history. Family History:  Family History  Problem Relation Age of Onset   Diabetes Mother    Hypertension Father    Hyperlipidemia Father    Heart disease Father    Diabetes Father     Social History:  Social History   Substance and Sexual Activity  Alcohol Use No     Social History   Substance and Sexual Activity  Drug Use Not Currently   Types: Marijuana   Comment: in  past    Social History   Socioeconomic History   Marital status: Single    Spouse name: Not on file   Number of children: 0   Years of education: Not on file   Highest education level: Not on file  Occupational History   Not on file  Tobacco Use    Smoking status: Never   Smokeless tobacco: Never  Vaping Use   Vaping status: Former   Substances: Nicotine   Substance and Sexual Activity   Alcohol use: No   Drug use: Not Currently    Types: Marijuana    Comment: in past   Sexual activity: Not Currently  Other Topics Concern   Not on file  Social History Narrative   Family business   Living with parents   Two stories   Right handed    Social Drivers of Health   Financial Resource Strain: Low Risk  (01/11/2023)   [facility]   Overall Financial Resource Strain (CARDIA)    Difficulty of Paying Living Expenses: Not hard at all  Food Insecurity: No Food Insecurity (08/05/2024)   Hunger Vital Sign    Worried About Running Out of Food in the Last Year: Never true    Ran Out of Food in the Last Year: Never true  Transportation Needs: No Transportation Needs (08/05/2024)   PRAPARE - Administrator, Civil Service (Medical): No    Lack of Transportation (Non-Medical): No  Physical Activity: Insufficiently Active (07/15/2021)   [facility] from [facility]   Exercise Vital Sign    On average, how many days per week do you engage in moderate to strenuous exercise (like a brisk walk)?: 3 days    On average, how many minutes do you engage in exercise at this level?: 30 min  Stress: Stress Concern Present (07/15/2021)   Received from [facility] Occupational [facility] - Occupational Stress Questionnaire    Feeling of Stress : To some extent  Social Connections: Unknown (08/05/2024)   Social Connection and Isolation Panel    Frequency of Communication with Friends and Family: More than three times a week    Frequency of Social Gatherings with Friends and Family: More than three times a week    Attends Religious Services: More than 4 times per year    Active Member of [employer] or Organizations: No    Attends Engineer, structural: Never    Marital Status: Patient unable to answer       Physical Findings: AIMS:  , ,  ,  ,  ,  ,   CIWA:  CIWA-Ar Total: 3 COWS:     Musculoskeletal: Strength & Muscle Tone: within normal limits Gait & Station: normal Patient leans: N/A   Psychiatric Specialty Exam:  Presentation  General Appearance:  Appropriate for Environment; Casual  Eye Contact: Fair  Speech: Clear and Coherent  Speech Volume: Normal  Handedness:No data recorded  Mood and Affect  Mood: Euthymic  Affect: Constricted; Appropriate   Thought Process  Thought Processes: Linear; Coherent  Descriptions of Associations:Intact  Orientation:Full (Time, Place and Person)  Thought Content:Logical  History of Schizophrenia/Schizoaffective disorder:No data recorded Duration of Psychotic Symptoms:No data recorded Hallucinations:Hallucinations: None  Ideas of Reference:None  Suicidal Thoughts:Suicidal Thoughts: No  Homicidal Thoughts:Homicidal Thoughts: No   Sensorium  Memory: Immediate Fair; Recent Fair; Remote Fair  Judgment: Fair  Insight: Fair   Executive Functions  Concentration: Fair  Attention Span:No data recorded Recall:No data  recorded [employer] of Knowledge:No data recorded Language:No data recorded  Psychomotor Activity  Psychomotor Activity: Psychomotor Activity: Normal   Assets  Assets: Communication Skills; Desire for Improvement; Financial Resources/Insurance; Housing; Physical Health; Social Support   Sleep  Sleep: Sleep: Fair  Estimated Sleeping Duration (Last 24 Hours): 6.50-7.75 hours   Physical Exam: Physical Exam Vitals and nursing note reviewed. Exam conducted with a chaperone present.  Constitutional:      General: He is not in acute distress.    Appearance: He is not toxic-appearing.  Pulmonary:     Effort: Pulmonary effort is normal. No respiratory distress.  Skin:    General: Skin is warm and dry.  Neurological:     Mental Status: He is alert.     Gait: Gait normal.     Review of Systems  Constitutional: Negative.  Negative for fever.  Respiratory: Negative.  Negative for shortness of breath.   Cardiovascular: Negative.   Gastrointestinal: Negative.  Negative for constipation and diarrhea.   Blood pressure 110/77, pulse 71, temperature 97.7 F (36.5 C), temperature source Oral, resp. rate 16, height 6' (1.829 m), weight 83.9 kg, SpO2 97%. Body mass index is 25.09 kg/m.   Social History   Tobacco Use  Smoking Status Never  Smokeless Tobacco Never   Tobacco Cessation:  A prescription for an FDA-approved tobacco cessation medication provided at discharge   Blood Alcohol level:  Lab Results  Component Value Date   [facility] <15 08/04/2024    Metabolic Disorder Labs:  Lab Results  Component Value Date   HGBA1C 5.9 12/21/2023   No results found for: PROLACTIN Lab Results  Component Value Date   CHOL 298 (H) 12/21/2023   TRIG 291.0 (H) 12/21/2023   HDL 40.20 12/21/2023   CHOLHDL 7 12/21/2023   VLDL 58.2 (H) 12/21/2023   LDLCALC 200 (H) 12/21/2023    See Psychiatric Specialty Exam and Suicide Risk Assessment completed by Attending Physician prior to discharge.  Discharge destination:  Home  Is patient on multiple antipsychotic therapies at discharge:  Yes,   Do you recommend tapering to monotherapy for antipsychotics?  No   Has Patient had three or more failed trials of antipsychotic monotherapy by history:  No  Recommended Plan for Multiple Antipsychotic Therapies: Additional reason(s) for multiple antispychotic treatment:  Monotherapy was not beneficial, Haldol  was needed in addition to the Olanzapine  to help specifically with the delusions, and it was greatly effective.  Discharge Instructions     Increase activity slowly   Complete by: As directed       Allergies as of 08/20/2024       Reactions   Pork Allergy Other (See Comments)   Religious/cultural restriction        Medication List     STOP taking these  medications    diazepam  10 MG tablet Commonly known as: Valium    mirtazapine 15 MG tablet Commonly known as: REMERON   QUEtiapine  50 MG tablet Commonly known as: SEROQUEL        TAKE these medications      Indication  clonazePAM  0.5 MG tablet Commonly known as: KLONOPIN  Take 1 tablet (0.5 mg total) by mouth 2 (two) times daily. What changed:  medication strength how much to take  Indication: Feeling Anxious   FLUoxetine  10 MG capsule Commonly known as: PROZAC  Take 3 capsules (30 mg total) by mouth daily. What changed:  medication strength how much to take  Indication: Major Depressive Disorder   haloperidol  5 MG tablet Commonly known  as: HALDOL  Take 1 tablet (5 mg total) by mouth every 12 (twelve) hours.  Indication: Psychosis   lithium  carbonate 300 MG ER tablet Commonly known as: LITHOBID  Take 1 tablet (300 mg total) by mouth every 12 (twelve) hours. What changed:  medication strength how much to take when to take this  Indication: Manic-Depression   methadone  10 MG/ML solution Commonly known as: DOLOPHINE  Take 125 mg by mouth daily.  Indication: Opioid Dependence   nicotine  polacrilex 2 MG gum Commonly known as: NICORETTE  Take 1 each (2 mg total) by mouth as needed for smoking cessation.  Indication: Nicotine  Addiction   OLANZapine  15 MG tablet Commonly known as: ZYPREXA  Take 1 tablet (15 mg total) by mouth at bedtime. What changed:  medication strength how much to take  Indication: Schizophrenia   OLANZapine  5 MG tablet Commonly known as: ZYPREXA  Take 1 tablet (5 mg total) by mouth daily. What changed: You were already taking a medication with the same name, and this prescription was added. Make sure you understand how and when to take each.  Indication: Schizophrenia   traZODone  100 MG tablet Commonly known as: DESYREL  Take 1 tablet (100 mg total) by mouth at bedtime as needed for sleep. What changed:  medication strength how much to  take when to take this reasons to take this  Indication: Trouble Sleeping        Follow-up Information     [facility]) Follow up on 08/20/2024.   Why: You may call this provider on 08/20/24 at 1:00 pm to schedule appointments for therapy and medication management services. Contact information: [facility] [address] [place], [place] [address]        [facility] Follow up on 08/20/2024.   Why: You have a hospital follow up appointment for therapy and medication management services on  9/30 at 3:15 pm.  The appointment will be Virtual, telehealth. Contact information: [address]  [address] [place] [address] [phone]                 Follow-up recommendations:   Activity: as tolerated   Diet: heart healthy   Other: -Follow-up with your outpatient psychiatric provider -instructions on appointment date, time, and address (location) are provided to you in discharge paperwork.   -Take your psychiatric medications as prescribed at discharge - instructions are provided to you in the discharge paperwork   -Follow-up with outpatient primary care doctor and other specialists -for management of chronic medical disease   -Recommend abstinence from alcohol, tobacco, and other illicit drug use at discharge.    -If your psychiatric symptoms recur, worsen, or if you have side effects to your psychiatric medications, call your outpatient psychiatric provider, 911, [phone] or go to the nearest emergency department.   -If suicidal thoughts recur, call your outpatient psychiatric provider, 911, [phone] or go to the nearest emergency department.  SignedBETHA Penne Mori, DO 08/20/2024, 12:42 PM

## 2024-08-20 NOTE — Plan of Care (Signed)
  Problem: Activity: Goal: Interest or engagement in activities will improve Outcome: Progressing   Problem: Coping: Goal: Ability to verbalize frustrations and anger appropriately will improve Outcome: Progressing   Problem: Physical Regulation: Goal: Ability to maintain clinical measurements within normal limits will improve Outcome: Progressing

## 2024-08-20 NOTE — Progress Notes (Signed)
 Matthew Roach  D/C'd Home per MD order.  Discussed with the patient and all questions fully answered.  An After Visit Summary was printed and given to the patient. Patient received prescription.  D/c education completed with patient and family including follow up instructions, medication list, d/c activities limitations if indicated, with other d/c instructions as indicated by MD - patient able to verbalize understanding, all questions fully answered.   Patient instructed to return to ED, call 911, or call MD for any changes in condition.   Patient escorted to the main entrance and D/C home via private auto.  Joaquin MALVA Doing 08/20/2024 12:53 PM

## 2024-08-20 NOTE — Group Note (Addendum)
 LCSW Group Therapy Note   Group Date: 08/19/2024 Start Time: 1300 End Time: 1400   Participation:  did not attend   Type of Therapy:  Group Therapy  Topic:  Healing Hearts:  A Safe Space for Grief  Objective:  The objective of this class, Healing Hearts: A Safe Space for Grief, is to create a compassionate environment where participants can process their grief, explore different stages of grief, and discover ways to honor their loved ones through personal rituals.  3 Goals:  Provide a safe and supportive space where participants  feel comfortable sharing their feelings and experiences of grief without judgment. 2.  Educate participants about the stages of grief and emphasize that there is no right way to grieve or a fixed timeline for healing. 3. Introduce the concept of rituals as a means to process grief, allowing individuals to honor their loved ones in a personal and meaningful way.  Summary:  In Healing Hearts: A Safe Space for Grief, we explored the unique and personal journey of grief, emphasizing that everyone experiences it differently. We discussed the five stages of grief (denial, anger, bargaining, depression, and acceptance), with the understanding that grief is not linear. Rituals were introduced as a way to help cope with loss, offering comfort and connection through meaningful actions such as lighting candles or taking memory walks. Participants were encouraged to express their emotions, focus on self-care, and reflect on moments of gratitude for their loved ones, recognizing that healing is a process and there is no timeline for grief.  Therapeutic Modalities: - Elements of CBT:  Cognitive Restructuring - Elements of DBT:  Distress Tolerance, Emotion Regulation   Hitomi Slape O Giovanna Kemmerer, LCSWA 08/19/2024

## 2024-08-20 NOTE — Telephone Encounter (Unsigned)
 Copied from CRM #[id number]. Topic: General - Other >> Aug 20, 2024 11:46 AM Henretta I wrote: Reason for CRM: bladyn tipps patients brother needs a call back from Dr. Kennyth, when ever he is available, best call back number is [phone]

## 2024-08-22 NOTE — Telephone Encounter (Signed)
 Left message to return call to our office at their convenience.

## 2024-08-23 ENCOUNTER — Other Ambulatory Visit: Payer: Self-pay | Admitting: Internal Medicine

## 2024-08-30 ENCOUNTER — Encounter: Payer: Self-pay | Admitting: Family Medicine

## 2024-09-02 NOTE — Telephone Encounter (Signed)
**Note De-identified  [place] Obfuscation** Please advise 

## 2024-09-02 NOTE — Telephone Encounter (Signed)
 Looks like he is scheduled with me for tomorrow - can we make sure that he has psychiatry follow up scheduled?  Worth HERO. Kennyth, MD 09/02/2024 2:24 PM

## 2024-09-02 NOTE — Telephone Encounter (Signed)
 Left message to return call to our office at their convenience.

## 2024-09-03 ENCOUNTER — Ambulatory Visit: Payer: MEDICAID | Admitting: Family Medicine

## 2024-09-03 ENCOUNTER — Ambulatory Visit (INDEPENDENT_AMBULATORY_CARE_PROVIDER_SITE_OTHER): Payer: MEDICAID | Admitting: Family Medicine

## 2024-09-03 ENCOUNTER — Encounter: Payer: Self-pay | Admitting: Family Medicine

## 2024-09-03 VITALS — BP 110/70 | HR 85 | Temp 98.1°F | Ht 72.0 in | Wt 210.4 lb

## 2024-09-03 DIAGNOSIS — L989 Disorder of the skin and subcutaneous tissue, unspecified: Secondary | ICD-10-CM | POA: Diagnosis not present

## 2024-09-03 DIAGNOSIS — F1129 Opioid dependence with unspecified opioid-induced disorder: Secondary | ICD-10-CM

## 2024-09-03 DIAGNOSIS — F419 Anxiety disorder, unspecified: Secondary | ICD-10-CM | POA: Diagnosis not present

## 2024-09-03 DIAGNOSIS — F22 Delusional disorders: Secondary | ICD-10-CM

## 2024-09-03 DIAGNOSIS — F1120 Opioid dependence, uncomplicated: Secondary | ICD-10-CM | POA: Insufficient documentation

## 2024-09-03 DIAGNOSIS — F319 Bipolar disorder, unspecified: Secondary | ICD-10-CM

## 2024-09-03 MED ORDER — CLONAZEPAM 0.5 MG PO TABS: 0.5000 mg | ORAL_TABLET | Freq: Two times a day (BID) | ORAL | 0 refills | Status: DC

## 2024-09-03 NOTE — Patient Instructions (Signed)
 It was very nice to see you today!  VISIT SUMMARY: You had a follow-up visit after your recent hospitalization for bipolar disorder. We discussed your current medications, anxiety management, seizure control, and your plan to taper off methadone .  YOUR PLAN: BIPOLAR DISORDER: You were recently hospitalized due to a severe episode and your medications were adjusted. -Continue taking your current medications as prescribed during your hospital stay. -Follow up with your psychiatrist for ongoing management and any necessary medication adjustments.  GENERALIZED ANXIETY DISORDER: Your anxiety was previously managed with Valium , but you prefer Klonopin  now. -Prescribe clonazepam  (Klonopin ) for anxiety management.  OPIOID DEPENDENCE ON METHADONE  MAINTENANCE THERAPY: You are currently tapering off methadone  with a goal to stop eventually. -Support your plan to reduce methadone  dosage by 10 mg per week as allowed by your treatment facility.  Return if symptoms worsen or fail to improve.   Take care, Dr Kennyth  PLEASE NOTE:  If you had any lab tests, please let us  know if you have not heard back within a few days. You may see your results on mychart before we have a chance to review them but we will give you a call once they are reviewed by us .   If we ordered any referrals today, please let us  know if you have not heard from their office within the next week.   If you had any urgent prescriptions sent in today, please check with the pharmacy within an hour of our visit to make sure the prescription was transmitted appropriately.   Please try these tips to maintain a healthy lifestyle:  Eat at least 3 REAL meals and 1-2 snacks per day.  Aim for no more than 5 hours between eating.  If you eat breakfast, please do so within one hour of getting up.   Each meal should contain half fruits/vegetables, one quarter protein, and one quarter carbs (no bigger than a computer mouse)  Cut down on sweet  beverages. This includes juice, soda, and sweet tea.   Drink at least 1 glass of water with each meal and aim for at least 8 glasses per day  Exercise at least 150 minutes every week.

## 2024-09-03 NOTE — Assessment & Plan Note (Signed)
 Had lengthy discussion with patient today regarding his recent hospitalization.  Overall symptoms are better controlled now with his current regimen that was prescribed with cardiology however he did not pick up his clonazepam  that was prescribed.  Database was reviewed today which did show that he picked up the Valium  a few weeks ago however has not picked up the clonazepam .  Patient and brother reports that he has been consistent with all the other medications that were prescribed at the time of discharge including Prozac  30 mg daily, Haldol  5 mg twice daily, lithium  300 mg twice daily, olanzapine  5 mg in the morning and 15 mg at night, and trazodone  100 mg nightly.  We will give a new prescription for clonazepam  that was prescribed at this time of discharge.  He is aware to not take this with the leftover Valium  that he has at home.  We did discuss importance, as we have done at his most recent visits here, of maintaining follow-up with psychiatry.  He will be seeing them in a week and a half for medication management though has already seen them once for what sounds like a therapy session.  Will defer further medication management to his psychiatry team.

## 2024-09-03 NOTE — Assessment & Plan Note (Signed)
??    Continue management per methadone clinic

## 2024-09-03 NOTE — Assessment & Plan Note (Signed)
 See anxiety and psychosis A/P.  No apparent manic symptoms today.  Will continue his regimen per psychiatry and he will follow-up with him next week.

## 2024-09-03 NOTE — Assessment & Plan Note (Signed)
 No apparent hallucinations or paranoia on exam today.  He will continue his regimen that was prescribed at the time of his discharge as above with Prozac  30 mg daily, Haldol  5 mg twice daily, lithium  300 mg twice daily, Zyprexa  5 mg in the morning and 15 mg in the evening and trazodone  100 mg nightly.

## 2024-09-03 NOTE — Progress Notes (Signed)
 Matthew Roach is a 38 y.o. male who presents today for an office visit.  Assessment/Plan:  New/Acute Problems: Skin lesion Will refer to dermatology.  Chronic Problems Addressed Today: Anxiety Had lengthy discussion with patient today regarding his recent hospitalization.  Overall symptoms are better controlled now with his current regimen that was prescribed with cardiology however he did not pick up his clonazepam  that was prescribed.  Database was reviewed today which did show that he picked up the Valium  a few weeks ago however has not picked up the clonazepam .  Patient and brother reports that he has been consistent with all the other medications that were prescribed at the time of discharge including Prozac  30 mg daily, Haldol  5 mg twice daily, lithium  300 mg twice daily, olanzapine  5 mg in the morning and 15 mg at night, and trazodone  100 mg nightly.  We will give a new prescription for clonazepam  that was prescribed at this time of discharge.  He is aware to not take this with the leftover Valium  that he has at home.  We did discuss importance, as we have done at his most recent visits here, of maintaining follow-up with psychiatry.  He will be seeing them in a week and a half for medication management though has already seen them once for what sounds like a therapy session.  Will defer further medication management to his psychiatry team.  Opioid dependence (HCC) Continue management per methadone  clinic.   Psychosis, paranoid (HCC) No apparent hallucinations or paranoia on exam today.  He will continue his regimen that was prescribed at the time of his discharge as above with Prozac  30 mg daily, Haldol  5 mg twice daily, lithium  300 mg twice daily, Zyprexa  5 mg in the morning and 15 mg in the evening and trazodone  100 mg nightly.  Bipolar 1 disorder (HCC) See anxiety and psychosis A/P.  No apparent manic symptoms today.  Will continue his regimen per psychiatry and he will follow-up  with him next week.     Subjective:  HPI:  See assessment / plan for status of chronic conditions.  Patient is here today for hospitalization follow-up.  I last saw him about 2 months ago.  Unfortunately, since our last visit he had an acute deterioration of his mental health and went to the emergency room on 08/05/2024 with hallucinations, paranoia, delusions and agitation.  His primary concern upon presentation to the ED was chest pain.  During his admission his Valium  was discontinued and taper down and he was started on Klonopin  0.5 mg twice daily as needed.  His Seroquel  was discontinued and he was restarted on olanzapine  which was increased to 5 mg in the morning and 15 mg nightly.  He was also started on Haldol  5 mg twice daily for the paranoia.  He was started on lithium  and this was increased to 300 mg twice daily and he was restarted on Prozac  30 mg daily.  His mental status significantly improved during his hospitalization and he was discharged home to continue the above medication changes  Discussed the use of AI scribe software for clinical note transcription with the patient, who gave verbal consent to proceed.  History of Present Illness Matthew Roach is a 38 year old male with bipolar disorder who presents for hospitalization follow-up.  He was hospitalized on August 05, 2024, due to an acute deterioration in his mental health, presenting with hallucinations, paranoia, delusions, and agitation. During this hospitalization, he was diagnosed with bipolar disorder. His primary  concern upon presentation to the emergency department was chest pain. During his hospital stay, several medication changes were made. Valium  was discontinued, and he was started on Klonopin  0.5 mg twice daily as needed. Seroquel  was discontinued, and he was restarted on olanzapine , which was increased to 5 mg in the morning and 15 mg nightly. He was also started on Haldol  5 mg twice daily for paranoia, lithium   was increased to 300 mg twice daily, and Prozac  30 mg daily was restarted.  He has not refilled the Klonopin  prescription since being discharged. He wants to return to Klonopin  at a light dose for anxiety management, as he believes it worked well for his anxiety and seizures in the past. He notes that he has been on Klonopin  before for seizures and anxiety, and it was effective. He has not experienced seizures since taking his medication regularly, but if he misses doses, he experiences 'brain zaps', muscle spasms, and eventually seizures by the third day.  He is currently on methadone  and is in the process of tapering down his dose, aiming to stop it eventually. He reports a reduction of 10 mg per week as allowed by his treatment facility, [facility]. He expresses frustration with the facility's policy of requiring a doctor's consultation to decrease the dose but not to increase it.  He has been home for about a week or two since discharge and reports that things have been fine. He is planning to return to work and the gym to get back to normal. He has been set up with a psychiatrist at [facility] and had his first visit recently, with the next appointment scheduled for September 12, 2024. His mother manages his medications, ensuring they are refilled and organized for him.         Objective:  Physical Exam: BP 110/70   Pulse 85   Temp 98.1 F (36.7 C) (Temporal)   Ht 6' (1.829 m)   Wt 210 lb 6.4 oz (95.4 kg)   SpO2 95%   BMI 28.54 kg/m   Gen: No acute distress, resting comfortably CV: Regular rate and rhythm with no murmurs appreciated Pulm: Normal work of breathing, clear to auscultation bilaterally with no crackles, wheezes, or rhonchi Skin: 7 to 8 mm well-circumscribed lesion on left neck Neuro: Grossly normal, moves all extremities Psych: Normal affect and thought content   Time Spent: 45 minutes of total time was spent on the date of the encounter performing the following actions:  chart review prior to seeing the patient including recent hospitalization, obtaining history, performing a medically necessary exam, counseling on the treatment plan, placing orders, and documenting in our EHR.        Worth HERO. Kennyth, MD 09/03/2024 11:20 AM

## 2024-09-05 NOTE — Telephone Encounter (Signed)
 Patient was seen on 03/05/2024

## 2024-10-04 ENCOUNTER — Other Ambulatory Visit: Payer: Self-pay | Admitting: Family Medicine

## 2024-11-03 ENCOUNTER — Other Ambulatory Visit: Payer: Self-pay | Admitting: Family Medicine

## 2024-11-04 NOTE — Telephone Encounter (Signed)
 Last OV: 09/03/2024  Next OV: ?  Last Refill: 10/07/24  Dispense: 60/0

## 2024-11-04 NOTE — Telephone Encounter (Signed)
 Prescription sent in but future refills should come from psychiatry. Has he seen them yet?

## 2024-11-15 ENCOUNTER — Ambulatory Visit: Payer: Self-pay

## 2024-11-15 ENCOUNTER — Telehealth: Payer: MEDICAID | Admitting: Family Medicine

## 2024-11-15 DIAGNOSIS — J069 Acute upper respiratory infection, unspecified: Secondary | ICD-10-CM

## 2024-11-15 MED ORDER — PREDNISONE 20 MG PO TABS: 20.0000 mg | ORAL_TABLET | Freq: Two times a day (BID) | ORAL | 0 refills | Status: AC

## 2024-11-15 MED ORDER — PROMETHAZINE-DM 6.25-15 MG/5ML PO SYRP: 5.0000 mL | ORAL_SOLUTION | Freq: Four times a day (QID) | ORAL | 0 refills | Status: AC | PRN

## 2024-11-15 NOTE — Telephone Encounter (Signed)
 Telehealth visit scheduled for 5:15 pm today. FYI

## 2024-11-15 NOTE — Patient Instructions (Signed)

## 2024-11-15 NOTE — Telephone Encounter (Signed)
 FYI Only or Action Required?: FYI only for provider: appointment scheduled on 11/15/24 via [facility] virtual visit.  Patient was last seen in primary care on 09/03/2024 by Kennyth Worth HERO, MD.  Called Nurse Triage reporting Fever and Nasal Congestion.  Symptoms began several days ago.  Interventions attempted: Rest, hydration, or home remedies.  Symptoms are: unchanged.  Triage Disposition: See Physician Within 24 Hours  Patient/caregiver understands and will follow disposition?: Yes    Copied from CRM #[id number]. Topic: Clinical - Red Word Triage >> Nov 15, 2024  4:11 PM Matthew Roach wrote: Red Word that prompted transfer to Nurse Triage: fever 106,chest congestion ,deep cough,chills     Reason for Disposition  [1] Fever returns after gone for over 24 hours AND [2] symptoms worse or not improved  Answer Assessment - Initial Assessment Questions Pt called in with flu like symptoms: fever, congestion, cough and chills x 3 days. Pt is unable to get accurate temperature, states he has a children's forehead thermometer that shows green/red sign. Pt states that he has had chills and his skin feels warm to the touch. Pt reports productive cough and nausea; pt is taking in fluids but has not had much to eat. Pt denies chest pain, SOB or h/a. Pt states that he is unsure of exposure. Appointment scheduled for evaluation. Patient agrees with plan of care, and will call back if anything changes, or if symptoms worsen.     1. ONSET: When did the nasal discharge start?      3 days ago   2. AMOUNT: How much discharge is there?      Able to clear mucous; dark brown   3. COUGH: Do you have a cough? If Yes, ask: Describe the color of your mucus. (e.g., clear, white, yellow, green)     Deep cough   4. RESPIRATORY DISTRESS: Describe your breathing.      SOB with cough   5. FEVER: Do you have a fever? If Yes, ask: What is your temperature, how was it measured, and when did it start?      Yes; last fever was 105F at 3am today via forehead thermometer but pt reports he does not feel reading   6. SEVERITY: Overall, how bad are you feeling right now? (e.g., doesn't interfere with normal activities, staying home from school/work, staying in bed)      Overall okay, reports fever comes and goes. Pt states he would not be able to go for a run d/t congestion but that he is doing okay   7. OTHER SYMPTOMS: Do you have any other symptoms? (e.g., earache, mouth sores, sore throat, wheezing)     Cough, chills  Protocols used: Common Cold-A-AH

## 2024-11-15 NOTE — Progress Notes (Signed)
 " Virtual Visit Consent   Matthew Roach, you are scheduled for a virtual visit with a [facility] provider today. Just as with appointments in the office, your consent must be obtained to participate. Your consent will be active for this visit and any virtual visit you may have with one of our providers in the next 365 days. If you have a MyChart account, a copy of this consent can be sent to you electronically.  As this is a virtual visit, video technology does not allow for your provider to perform a traditional examination. This may limit your provider's ability to fully assess your condition. If your provider identifies any concerns that need to be evaluated in person or the need to arrange testing (such as labs, EKG, etc.), we will make arrangements to do so. Although advances in technology are sophisticated, we cannot ensure that it will always work on either your end or our end. If the connection with a video visit is poor, the visit may have to be switched to a telephone visit. With either a video or telephone visit, we are not always able to ensure that we have a secure connection.  By engaging in this virtual visit, you consent to the provision of healthcare and authorize for your insurance to be billed (if applicable) for the services provided during this visit. Depending on your insurance coverage, you may receive a charge related to this service.  I need to obtain your verbal consent now. Are you willing to proceed with your visit today? Matthew Roach has provided verbal consent on 11/15/2024 for a virtual visit (video or telephone). Matthew Lamp, FNP  Date: 11/15/2024 5:17 PM   Virtual Visit via Video Note   I, Matthew Roach, connected with  Matthew Roach  ([id number], 01-01-1986) on 11/15/2024 at  5:15 PM EST by a video-enabled telemedicine application and verified that I am speaking with the correct person using two identifiers.  Location: Patient: Virtual Visit Location Patient:  Home Provider: Virtual Visit Location Provider: Home Office   I discussed the limitations of evaluation and management by telemedicine and the availability of in person appointments. The patient expressed understanding and agreed to proceed.    History of Present Illness: Matthew Roach is a 38 y.o. who identifies as a male who was assigned male at birth, and is being seen today for fever, nausea, fatigue. Sx for 3 days. Has a bad cough- mucus is brown. No wheezing or sob. Nasal congestion.   HPI: HPI  Problems:  Patient Active Problem List   Diagnosis Date Noted   Opioid dependence (HCC) 09/03/2024   Bipolar 1 disorder (HCC) 08/05/2024   Psychosis, paranoid (HCC) 01/24/2024   Anxiety 04/13/2022    Allergies: Allergies[1] Medications: Current Medications[2]  Observations/Objective: Patient is well-developed, well-nourished in no acute distress.  Resting comfortably  at home.  Head is normocephalic, atraumatic.  No labored breathing.  Speech is clear and coherent with logical content.  Patient is alert and oriented at baseline.    Assessment and Plan: 1. Viral upper respiratory tract infection (Primary)  Increase fluids, humidifier at night, tylenol  or ibuprofen as directed, UC if sx worsen. Advised to check a covid and flu test at home.   Follow Up Instructions: I discussed the assessment and treatment plan with the patient. The patient was provided an opportunity to ask questions and all were answered. The patient agreed with the plan and demonstrated an understanding of the instructions.  A copy of instructions were sent to  the patient via MyChart unless otherwise noted below.     The patient was advised to call back or seek an in-person evaluation if the symptoms worsen or if the condition fails to improve as anticipated.    Matthew Remmel, FNP     [1]  Allergies Allergen Reactions   Pork Allergy Other (See Comments)    Religious/cultural restriction  [2]  Current  Outpatient Medications:    clonazePAM  (KLONOPIN ) 0.5 MG tablet, TAKE 1 TABLET BY MOUTH TWICE A DAY, Disp: 60 tablet, Rfl: 0   FLUoxetine  (PROZAC ) 10 MG capsule, Take 3 capsules (30 mg total) by mouth daily., Disp: 90 capsule, Rfl: 0   haloperidol  (HALDOL ) 5 MG tablet, Take 1 tablet (5 mg total) by mouth every 12 (twelve) hours., Disp: 60 tablet, Rfl: 0   lithium  carbonate (LITHOBID ) 300 MG ER tablet, Take 1 tablet (300 mg total) by mouth every 12 (twelve) hours., Disp: 60 tablet, Rfl: 0   methadone  (DOLOPHINE ) 10 MG/ML solution, Take 125 mg by mouth daily., Disp: , Rfl:    nicotine  polacrilex (NICORETTE ) 2 MG gum, Take 1 each (2 mg total) by mouth as needed for smoking cessation., Disp: 100 tablet, Rfl: 0   OLANZapine  (ZYPREXA ) 15 MG tablet, Take 1 tablet (15 mg total) by mouth at bedtime., Disp: 30 tablet, Rfl: 0   OLANZapine  (ZYPREXA ) 5 MG tablet, Take 1 tablet (5 mg total) by mouth daily., Disp: 30 tablet, Rfl: 0   traZODone  (DESYREL ) 100 MG tablet, Take 1 tablet (100 mg total) by mouth at bedtime as needed for sleep., Disp: 30 tablet, Rfl: 0  "

## 2024-12-02 ENCOUNTER — Other Ambulatory Visit: Payer: Self-pay | Admitting: Family Medicine

## 2025-04-08 ENCOUNTER — Ambulatory Visit: Payer: MEDICAID | Admitting: Physician Assistant
